# Patient Record
Sex: Female | Born: 1960
Health system: Southern US, Community
[De-identification: ages and names within clinical notes are randomized; demographics above are authoritative.]

## PROBLEM LIST (undated history)

## (undated) DIAGNOSIS — M109 Gout, unspecified: Secondary | ICD-10-CM

## (undated) DIAGNOSIS — M791 Myalgia, unspecified site: Secondary | ICD-10-CM

## (undated) DIAGNOSIS — F419 Anxiety disorder, unspecified: Secondary | ICD-10-CM

## (undated) DIAGNOSIS — M1990 Unspecified osteoarthritis, unspecified site: Secondary | ICD-10-CM

## (undated) DIAGNOSIS — G4730 Sleep apnea, unspecified: Secondary | ICD-10-CM

## (undated) DIAGNOSIS — K8020 Calculus of gallbladder without cholecystitis without obstruction: Secondary | ICD-10-CM

## (undated) DIAGNOSIS — R519 Headache, unspecified: Secondary | ICD-10-CM

## (undated) DIAGNOSIS — K56609 Unspecified intestinal obstruction, unspecified as to partial versus complete obstruction: Secondary | ICD-10-CM

## (undated) DIAGNOSIS — G5602 Carpal tunnel syndrome, left upper limb: Secondary | ICD-10-CM

## (undated) DIAGNOSIS — Z87442 Personal history of urinary calculi: Secondary | ICD-10-CM

## (undated) DIAGNOSIS — K219 Gastro-esophageal reflux disease without esophagitis: Secondary | ICD-10-CM

## (undated) DIAGNOSIS — F32A Depression, unspecified: Secondary | ICD-10-CM

## (undated) DIAGNOSIS — T884XXA Failed or difficult intubation, initial encounter: Secondary | ICD-10-CM

## (undated) DIAGNOSIS — F329 Major depressive disorder, single episode, unspecified: Secondary | ICD-10-CM

## (undated) DIAGNOSIS — N200 Calculus of kidney: Secondary | ICD-10-CM

## (undated) DIAGNOSIS — Z98811 Dental restoration status: Secondary | ICD-10-CM

## (undated) DIAGNOSIS — E785 Hyperlipidemia, unspecified: Secondary | ICD-10-CM

## (undated) DIAGNOSIS — R7303 Prediabetes: Secondary | ICD-10-CM

## (undated) DIAGNOSIS — R112 Nausea with vomiting, unspecified: Secondary | ICD-10-CM

## (undated) DIAGNOSIS — K222 Esophageal obstruction: Secondary | ICD-10-CM

## (undated) DIAGNOSIS — Z9889 Other specified postprocedural states: Secondary | ICD-10-CM

## (undated) DIAGNOSIS — I10 Essential (primary) hypertension: Secondary | ICD-10-CM

## (undated) DIAGNOSIS — J45909 Unspecified asthma, uncomplicated: Secondary | ICD-10-CM

## (undated) DIAGNOSIS — Z9189 Other specified personal risk factors, not elsewhere classified: Secondary | ICD-10-CM

## (undated) HISTORY — DX: Unspecified intestinal obstruction, unspecified as to partial versus complete obstruction: K56.609

## (undated) HISTORY — PX: TONSILLECTOMY AND ADENOIDECTOMY: SHX28

## (undated) HISTORY — DX: Anxiety disorder, unspecified: F41.9

## (undated) HISTORY — DX: Headache, unspecified: R51.9

## (undated) HISTORY — DX: Unspecified osteoarthritis, unspecified site: M19.90

## (undated) HISTORY — PX: ESOPHAGEAL DILATION: SHX303

## (undated) HISTORY — DX: Calculus of gallbladder without cholecystitis without obstruction: K80.20

## (undated) HISTORY — DX: Gastro-esophageal reflux disease without esophagitis: K21.9

## (undated) HISTORY — DX: Hyperlipidemia, unspecified: E78.5

## (undated) HISTORY — DX: Calculus of kidney: N20.0

## (undated) HISTORY — DX: Unspecified asthma, uncomplicated: J45.909

---

## 1983-12-01 HISTORY — PX: CHOLECYSTECTOMY: SHX55

## 2001-01-28 ENCOUNTER — Ambulatory Visit (HOSPITAL_COMMUNITY): Admission: RE | Admit: 2001-01-28 | Discharge: 2001-01-28 | Payer: Self-pay | Admitting: Obstetrics and Gynecology

## 2001-01-28 ENCOUNTER — Encounter (INDEPENDENT_AMBULATORY_CARE_PROVIDER_SITE_OTHER): Payer: Self-pay

## 2001-01-28 HISTORY — PX: HYSTEROSCOPY W/D&C: SHX1775

## 2001-05-26 ENCOUNTER — Encounter (INDEPENDENT_AMBULATORY_CARE_PROVIDER_SITE_OTHER): Payer: Self-pay

## 2001-05-26 ENCOUNTER — Other Ambulatory Visit: Admission: RE | Admit: 2001-05-26 | Discharge: 2001-05-26 | Payer: Self-pay | Admitting: Obstetrics and Gynecology

## 2003-06-14 ENCOUNTER — Encounter: Admission: RE | Admit: 2003-06-14 | Discharge: 2003-06-14 | Payer: Self-pay | Admitting: Family Medicine

## 2003-06-14 ENCOUNTER — Encounter: Payer: Self-pay | Admitting: Family Medicine

## 2003-10-11 ENCOUNTER — Other Ambulatory Visit: Admission: RE | Admit: 2003-10-11 | Discharge: 2003-10-11 | Payer: Self-pay | Admitting: Obstetrics and Gynecology

## 2004-12-25 ENCOUNTER — Other Ambulatory Visit: Admission: RE | Admit: 2004-12-25 | Discharge: 2004-12-25 | Payer: Self-pay | Admitting: Obstetrics and Gynecology

## 2005-07-19 ENCOUNTER — Encounter: Admission: RE | Admit: 2005-07-19 | Discharge: 2005-07-19 | Payer: Self-pay | Admitting: Orthopedic Surgery

## 2005-08-20 ENCOUNTER — Encounter: Admission: RE | Admit: 2005-08-20 | Discharge: 2005-08-20 | Payer: Self-pay | Admitting: Orthopedic Surgery

## 2008-09-24 ENCOUNTER — Observation Stay (HOSPITAL_COMMUNITY): Admission: EM | Admit: 2008-09-24 | Discharge: 2008-09-25 | Payer: Self-pay | Admitting: Emergency Medicine

## 2008-12-11 ENCOUNTER — Ambulatory Visit: Payer: Self-pay | Admitting: Pulmonary Disease

## 2008-12-11 ENCOUNTER — Inpatient Hospital Stay (HOSPITAL_COMMUNITY): Admission: RE | Admit: 2008-12-11 | Discharge: 2008-12-13 | Payer: Self-pay | Admitting: Obstetrics and Gynecology

## 2008-12-11 ENCOUNTER — Encounter (INDEPENDENT_AMBULATORY_CARE_PROVIDER_SITE_OTHER): Payer: Self-pay | Admitting: Obstetrics and Gynecology

## 2008-12-11 HISTORY — PX: BILATERAL SALPINGOOPHORECTOMY: SHX1223

## 2008-12-11 HISTORY — PX: LAPAROSCOPIC SUPRACERVICAL HYSTERECTOMY: SUR797

## 2009-03-05 ENCOUNTER — Encounter (INDEPENDENT_AMBULATORY_CARE_PROVIDER_SITE_OTHER): Payer: Self-pay | Admitting: *Deleted

## 2009-03-15 ENCOUNTER — Encounter (INDEPENDENT_AMBULATORY_CARE_PROVIDER_SITE_OTHER): Payer: Self-pay | Admitting: *Deleted

## 2009-08-24 ENCOUNTER — Emergency Department (HOSPITAL_COMMUNITY): Admission: EM | Admit: 2009-08-24 | Discharge: 2009-08-24 | Payer: Self-pay | Admitting: Emergency Medicine

## 2009-09-05 ENCOUNTER — Encounter: Admission: RE | Admit: 2009-09-05 | Discharge: 2009-09-05 | Payer: Self-pay | Admitting: Family Medicine

## 2009-10-12 ENCOUNTER — Encounter: Admission: RE | Admit: 2009-10-12 | Discharge: 2009-10-12 | Payer: Self-pay | Admitting: Gastroenterology

## 2009-12-12 ENCOUNTER — Telehealth: Payer: Self-pay | Admitting: Gastroenterology

## 2010-04-17 ENCOUNTER — Encounter (INDEPENDENT_AMBULATORY_CARE_PROVIDER_SITE_OTHER): Payer: Self-pay | Admitting: *Deleted

## 2010-04-21 ENCOUNTER — Ambulatory Visit: Payer: Self-pay | Admitting: Gastroenterology

## 2010-05-09 ENCOUNTER — Ambulatory Visit: Payer: Self-pay | Admitting: Gastroenterology

## 2010-05-18 ENCOUNTER — Encounter: Payer: Self-pay | Admitting: Gastroenterology

## 2010-12-21 ENCOUNTER — Encounter: Payer: Self-pay | Admitting: Family Medicine

## 2010-12-30 NOTE — Letter (Signed)
Summary: [facility] Instructions  [facility]  [address] [place], [place] [address]   Phone: [phone]  Fax: [phone]       [name]    1961-01-03    MRN: [id number]        Procedure Day /Date:  05/09/10   Friday     Arrival Time:  10:30am      Procedure Time:  11:30am     Location of Procedure:                    _ x_  [facility] (4th Floor)   PREPARATION FOR COLONOSCOPY WITH MOVIPREP   Starting 5 days prior to your procedure _6/5/11 _ do not eat nuts, seeds, popcorn, corn, beans, peas,  salads, or any raw vegetables.  Do not take any fiber supplements (e.g. Metamucil, Citrucel, and Benefiber).  THE DAY BEFORE YOUR PROCEDURE         DATE:  05/08/10  DAY:  Thursday  1.  Drink clear liquids the entire day-NO SOLID FOOD  2.  Do not drink anything colored red or purple.  Avoid juices with pulp.  No orange juice.  3.  Drink at least 64 oz. (8 glasses) of fluid/clear liquids during the day to prevent dehydration and help the prep work efficiently.  CLEAR LIQUIDS INCLUDE: Water Jello Ice Popsicles Tea (sugar ok, no milk/cream) Powdered fruit flavored drinks Coffee (sugar ok, no milk/cream) Gatorade Juice: apple, white grape, white cranberry  Lemonade Clear bullion, consomm, broth Carbonated beverages (any kind) Strained chicken noodle soup Hard Candy                             4.  In the morning, mix first dose of MoviPrep solution:    Empty 1 Pouch A and 1 Pouch B into the disposable container    Add lukewarm drinking water to the top line of the container. Mix to dissolve    Refrigerate (mixed solution should be used within 24 hrs)  5.  Begin drinking the prep at 5:00 p.m. The MoviPrep container is divided by 4 marks.   Every 15 minutes drink the solution down to the next mark (approximately 8 oz) until the full liter is complete.   6.  Follow completed prep with 16 oz of clear liquid of your choice (Nothing red or purple).   Continue to drink clear liquids until bedtime.  7.  Before going to bed, mix second dose of MoviPrep solution:    Empty 1 Pouch A and 1 Pouch B into the disposable container    Add lukewarm drinking water to the top line of the container. Mix to dissolve    Refrigerate  THE DAY OF YOUR PROCEDURE      DATE:   05/09/10  DAY:   Friday  Beginning at  6:30 a.m. (5 hours before procedure):         1. Every 15 minutes, drink the solution down to the next mark (approx 8 oz) until the full liter is complete.  2. Follow completed prep with 16 oz. of clear liquid of your choice.    3. You may drink clear liquids until  9:30am   (2 HOURS BEFORE PROCEDURE).   MEDICATION INSTRUCTIONS  Unless otherwise instructed, you should take regular prescription medications with a small sip of water   as early as possible the morning of your procedure.  OTHER INSTRUCTIONS  You will need a responsible adult at least 52 years of age to accompany you and drive you home.   This person must remain in the waiting room during your procedure.  Wear loose fitting clothing that is easily removed.  Leave jewelry and other valuables at home.  However, you may wish to bring a book to read or  an iPod/MP3 player to listen to music as you wait for your procedure to start.  Remove all body piercing jewelry and leave at home.  Total time from sign-in until discharge is approximately 2-3 hours.  You should go home directly after your procedure and rest.  You can resume normal activities the  day after your procedure.  The day of your procedure you should not:   Drive   Make legal decisions   Operate machinery   Drink alcohol   Return to work  You will receive specific instructions about eating, activities and medications before you leave.    The above instructions have been reviewed and explained to me by  Wyona Almas RN  Apr 21, 2010 4:32 PM     I fully understand and can verbalize  these instructions _____________________________ Date _________

## 2010-12-30 NOTE — Letter (Signed)
Summary: Patient Notice-Hyperplastic Polyps  [facility]  [address] [place], [place] [address]   Phone: [phone]  Fax: [phone]        May 18, 2010 MRN: [id number]    [name] [address] [place], [place]  [address]    Dear [name],  I am pleased to inform you that the colon polyp(s) removed during your recent colonoscopy was (were) found to be hyperplastic. These types of polyps are NOT pre-cancerous.  It is my recommendation that you have a repeat colonoscopy examination in 10 years for routine colorectal cancer screening.  Should you develop new or worsening symptoms of abdominal pain, bowel habit changes or bleeding from the rectum or bowels, please schedule an evaluation with either your primary care physician or with me.  Continue treatment plan as outlined the day of your exam.  Please call us if you are having persistent problems or have questions about your condition that have not been fully answered at this time.  Sincerely,  Meryl Dare MD [facility]  This letter has been electronically signed by your physician.  Appended Document: Patient Notice-Hyperplastic Polyps letter mailed.

## 2010-12-30 NOTE — Procedures (Signed)
Summary: Colonoscopy  Patient: [name] Note: All result statuses are Final unless otherwise noted.  Tests: (1) Colonoscopy (COL)   COL Colonoscopy           DONE     [facility]     [address]     [place], [place]  [address]           COLONOSCOPY PROCEDURE REPORT           PATIENT:  [name], [name]  MR#:  [id number]     BIRTHDATE:  Aug 29, 1961, 49 yrs. old  GENDER:  female     ENDOSCOPIST:  Judie Petit T. Russella Dar, MD, [facility]           PROCEDURE DATE:  05/09/2010     PROCEDURE:  Colonoscopy with biopsy     ASA CLASS:  Class II     INDICATIONS:  Routine Risk Screening     MEDICATIONS:   Fentanyl 75 mcg IV, Versed 7 mg IV     DESCRIPTION OF PROCEDURE:   After the risks benefits and     alternatives of the procedure were thoroughly explained, informed     consent was obtained.  Digital rectal exam was performed and     revealed no abnormalities.   The LB [id number]-[id number] endoscope     was introduced through the anus and advanced to the cecum, which     was identified by both the appendix and ileocecal valve, without     limitations.  The quality of the prep was excellent, using     MoviPrep.  The instrument was then slowly withdrawn as the colon     was fully examined.     <<PROCEDUREIMAGES>>     FINDINGS:  A sessile polyp was found in the sigmoid colon. It was     3 mm in size. The polyp was removed using cold biopsy forceps.  A     normal appearing cecum, ileocecal valve, and appendiceal orifice     were identified. The ascending, hepatic flexure, transverse,     splenic flexure, descending  colon, and rectum appeared     unremarkable.  Retroflexed views in the rectum revealed no     abnormalities. The time to cecum =  2.5  minutes. The scope was     then withdrawn (time =  9.75  min) from the patient and the     procedure completed.           COMPLICATIONS:  None           ENDOSCOPIC IMPRESSION:     1) 3 mm sessile polyp in the sigmoid colon            RECOMMENDATIONS:     1) Await pathology results     2) If the polyp removed today is adenomatous (pre-cancerous),     you will need a repeat colonoscopy in 5 years. Otherwise you     should continue to follow colorectal cancer screening guidelines     for "routine risk" patients with colonoscopy in 10 years.     Venita Lick. Russella Dar, MD, [facility]           CC: Catha Gosselin, MD           n.     Rosalie DoctorVenita Lick. Stark at 05/09/2010 12:07 PM           Stephaney, Steven, [phone]  Note: An exclamation mark (!) indicates a result that was  not dispersed into the flowsheet. Document Creation Date: 05/09/2010 12:08 PM _______________________________________________________________________  (1) Order result status: Final Collection or observation date-time: 05/09/2010 12:01 Requested date-time:  Receipt date-time:  Reported date-time:  Referring Physician:   Ordering Physician: Claudette Head [phone]) Specimen Source:  Source: Launa Grill Order Number: [id number] Lab site:   Appended Document: Colonoscopy     Procedures Next Due Date:    Colonoscopy: 04/2020

## 2010-12-30 NOTE — Miscellaneous (Signed)
Summary: [facility] Previsit/prep  Clinical Lists Changes  Medications: Added new medication of MOVIPREP 100 GM  SOLR (PEG-KCL-NACL-NASULF-NA ASC-C) As per prep instructions. - Signed Rx of MOVIPREP 100 GM  SOLR (PEG-KCL-NACL-NASULF-NA ASC-C) As per prep instructions.;  #1 x 0;  Signed;  Entered by: Wyona Almas RN;  Authorized by: Meryl Dare MD [facility];  Method used: Electronically to [id number] [address], [place], [place]  [address], Ph: [phone] or [phone], Fax: [phone] Allergies: Added new allergy or adverse reaction of ASPIRIN Observations: Added new observation of NKA: F (04/21/2010 16:02)    Prescriptions: MOVIPREP 100 GM  SOLR (PEG-KCL-NACL-NASULF-NA ASC-C) As per prep instructions.  #1 x 0   Entered by:   Wyona Almas RN   Authorized by:   Meryl Dare MD [facility]   Signed by:   Wyona Almas RN on 04/21/2010   Method used:   Electronically to        [id number] (retail)       [address]       [place], [place]  [address]       Ph: [phone] or [phone]       Fax: [phone]   RxID:   [id number]

## 2010-12-30 NOTE — Progress Notes (Signed)
Summary: Schedule Colonoscopy  Phone Note Outgoing Call   Call placed by: Hortense Ramal CMA Duncan Dull),  December 12, 2009 10:12 AM Call placed to: Patient Summary of Call: I have called to advise patient that it is time for her recall colonoscopy (due to her past history of diverticulosis and due to age). Patient states that she is unable to schedule at this moment but will call back shortly to do so. Initial call taken by: Hortense Ramal CMA Duncan Dull),  December 12, 2009 10:13 AM  Follow-up for Phone Call        Phone # busy x 2. Will call back at a later time. Hortense Ramal CMA Duncan Dull)  December 17, 2009 12:38 PM   Patient's phone is busy x 2. We will need to send her a letter. Follow-up by: Hortense Ramal CMA [facility] Dull),  December 20, 2009 10:11 AM

## 2011-03-06 LAB — DIFFERENTIAL
Basophils Absolute: 0.1 10*3/uL (ref 0.0–0.1)
Basophils Relative: 1 % (ref 0–1)
Monocytes Absolute: 0.8 10*3/uL (ref 0.1–1.0)
Neutrophils Relative %: 79 % — ABNORMAL HIGH (ref 43–77)

## 2011-03-06 LAB — URINALYSIS, ROUTINE W REFLEX MICROSCOPIC
Bilirubin Urine: NEGATIVE
Specific Gravity, Urine: 1.028 (ref 1.005–1.030)
Urobilinogen, UA: 0.2 mg/dL (ref 0.0–1.0)

## 2011-03-06 LAB — COMPREHENSIVE METABOLIC PANEL
BUN: 9 mg/dL (ref 6–23)
CO2: 28 mEq/L (ref 19–32)
Calcium: 9 mg/dL (ref 8.4–10.5)
GFR calc Af Amer: >60 mL/min (ref >60)
GFR calc non Af Amer: >60 mL/min (ref >60)
Glucose, Bld: 98 mg/dL (ref 70–99)
Sodium: 140 mEq/L (ref 135–145)

## 2011-03-06 LAB — CBC
HCT: 37.2 % (ref 36.0–46.0)
MCHC: 32.7 g/dL (ref 30.0–36.0)

## 2011-03-06 LAB — TRIGLYCERIDES: Triglycerides: 114 mg/dL (ref <150)

## 2011-03-06 LAB — CK: Total CK: 42 U/L (ref 7–177)

## 2011-03-06 LAB — URINE MICROSCOPIC-ADD ON

## 2011-03-16 LAB — COMPREHENSIVE METABOLIC PANEL
ALT: 10 U/L (ref 0–35)
AST: 17 U/L (ref 0–37)
Albumin: 3 g/dL — ABNORMAL LOW (ref 3.5–5.2)
Albumin: 3.4 g/dL — ABNORMAL LOW (ref 3.5–5.2)
Alkaline Phosphatase: 58 U/L (ref 39–117)
BUN: 7 mg/dL (ref 6–23)
CO2: 27 mEq/L (ref 19–32)
CO2: 30 mEq/L (ref 19–32)
Calcium: 8.1 mg/dL — ABNORMAL LOW (ref 8.4–10.5)
Calcium: 8.3 mg/dL — ABNORMAL LOW (ref 8.4–10.5)
Calcium: 8.5 mg/dL (ref 8.4–10.5)
Chloride: 103 mEq/L (ref 96–112)
Creatinine, Ser: 0.58 mg/dL (ref 0.4–1.2)
GFR calc Af Amer: >60 mL/min (ref >60)
GFR calc Af Amer: >60 mL/min (ref >60)
GFR calc non Af Amer: >60 mL/min (ref >60)
GFR calc non Af Amer: >60 mL/min (ref >60)
Glucose, Bld: 150 mg/dL — ABNORMAL HIGH (ref 70–99)
Potassium: 3.7 mEq/L (ref 3.5–5.1)
Sodium: 137 mEq/L (ref 135–145)
Sodium: 138 mEq/L (ref 135–145)
Sodium: 139 mEq/L (ref 135–145)
Total Bilirubin: 0.4 mg/dL (ref 0.3–1.2)
Total Protein: 5.7 g/dL — ABNORMAL LOW (ref 6.0–8.3)
Total Protein: 6.3 g/dL (ref 6.0–8.3)

## 2011-03-16 LAB — CBC
HCT: 25.4 % — ABNORMAL LOW (ref 36.0–46.0)
Hemoglobin: 8.2 g/dL — ABNORMAL LOW (ref 12.0–15.0)
MCHC: 31.8 g/dL (ref 30.0–36.0)
MCHC: 32.3 g/dL (ref 30.0–36.0)
MCHC: 32.5 g/dL (ref 30.0–36.0)
MCHC: 32.6 g/dL (ref 30.0–36.0)
MCV: 75.4 fL — ABNORMAL LOW (ref 78.0–100.0)
MCV: 75.8 fL — ABNORMAL LOW (ref 78.0–100.0)
Platelets: 289 10*3/uL (ref 150–400)
Platelets: 315 10*3/uL (ref 150–400)
RBC: 3.8 MIL/uL — ABNORMAL LOW (ref 3.87–5.11)
RDW: 19.8 % — ABNORMAL HIGH (ref 11.5–15.5)
RDW: 19.9 % — ABNORMAL HIGH (ref 11.5–15.5)
WBC: 14.1 10*3/uL — ABNORMAL HIGH (ref 4.0–10.5)
WBC: 14.5 10*3/uL — ABNORMAL HIGH (ref 4.0–10.5)
WBC: 6.3 10*3/uL (ref 4.0–10.5)

## 2011-03-16 LAB — TYPE AND SCREEN: Antibody Screen: NEGATIVE

## 2011-04-14 NOTE — H&P (Signed)
NAMESHARISA, [name]                   ACCOUNT NO.:  [id number]   MEDICAL RECORD NO.:  [id number]          PATIENT TYPE:  INP   LOCATION:  3705                         FACILITY:  [facility]   PHYSICIAN:  Ramiro Harvest, MD    DATE OF BIRTH:  13-Feb-1961   DATE OF ADMISSION:  09/24/2008  DATE OF DISCHARGE:                              HISTORY & PHYSICAL   PRIMARY CARE PHYSICIAN:  [name], MD   NEUROLOGIST:  Melvyn Novas, MD   HISTORY OF PRESENT ILLNESS:  [name] is a 50 year old obese white  female with history of cholecystectomy, hypertriglyceridemia, GERD,  hiatal hernia, family history of AAA who presents from PCP's office to  the ED with a 2-week history of squeezing midsternal chest pain lasting  seconds, occurring on exertion with associated shortness of breath.  The  patient states that sometimes the chest pain radiates to head causing a  headache on the top and the back of the head.  No paroxysmal nocturnal  dyspnea.  No orthopnea.  No chills.  No fever.  No nausea.  No vomiting.  No diaphoresis.  No melena.  No hematochezia.  No hematemesis.  No  burning sensation.  The patient is also complaining of abdominal pain  that has been ongoing for several months and worsening over the past 2  weeks, describes as an aching feeling, nonradiating.  No fever.  No  chills.  No nausea.  No vomiting.  No cough.  No diarrhea.  No  constipation.  No alleviating factors.  The patient states that  abdominal pain is also associated with meals as denies any NSAID use.  The patient states that abdominal pain similar to the pain she had prior  to her cholecystectomy.  The patient does endorse somewhat generalized  fatigue and headache.  The patient being consulted on per Cardiology.  The patient is being admitted for further evaluation and  recommendations.   ALLERGIES:  AVELOX, NAPROSYN, ASPIRIN, DAYPRO, and DEMEROL.   PAST MEDICAL HISTORY:  1. GERD.  2. Hiatal hernia.  3. History  of nephrolithiasis.  4. Obstructive sleep apnea.  5. Mild depression.  6. Fibroids.  7. Status post cholecystectomy in 1985.  8. Hypertriglyceridemia.  9. History of Lyme disease in 1990.  10.History of chronic diarrhea in the past.  11.History of occasional dysphagia in the past.  12.Status post D and C and hysteroscopy in January 28, 2001.  13.Prior history of asthma.   MEDICATIONS:  1. Lexapro 10 mg daily.  2. Prilosec 20 mg daily as needed.   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  She is married, lives in [place].  No tobacco use.  No alcohol use.  No IV drug use.  The patient has two children, all of whom are healthy.   FAMILY HISTORY:  Father deceased at age 62 from emphysema, also did have  a history of 3 aneurysms, history of hypertension, coronary artery  disease, and history of peripheral vascular disease.  Mother alive age  75 with a history of hypertension, diabetes, depression, and  dementia.  One cousin who is now diseased at a history of spina bifida and the  grandmother with a history of diabetes.   REVIEW OF SYSTEMS:  As per HPI, otherwise negative.   PHYSICAL EXAM:  VITAL SIGNS:  Temperature 98.5, blood pressure 179/91,  pulse of 64, respiratory rate 20, and sating 99% on room air.  GENERAL:  The patient in no apparent distress.  HEENT:  Normocephalic and atraumatic.  Pupils are equal, round, and  reactive to light and accommodation.  Extraocular movements are intact.  Oropharynx is clear.  No lesions.  No exudates.  NECK:  Supple.  No lymphadenopathy.  RESPIRATORY:  Lungs are clear to auscultation bilaterally.  No wheezes.  No crackles.  No rhonchi.  CARDIOVASCULAR:  Regular rate and rhythm.  No murmurs, rubs, or gallops.  ABDOMEN:  Soft, positive bowel sounds.  Tenderness to palpation in the  epigastrium in the right upper quadrant.  EXTREMITIES:  No clubbing, cyanosis, or edema.  NEUROLOGIC:  The patient is alert and  oriented x3.  Cranial nerves II  through XII are grossly intact.  No focal deficits.   ADMISSION LABS:  Beta-hCG was less than 2, magnesium level of 2.2.  CBC;  white count 5.9, hemoglobin 9.9, hematocrit 31.5, platelets of 288, and  ANC of 4.3.  Point of care cardiac markers; CK-MB less than 1, troponin  I less than 0.05, and myoglobin of 34.7.  Comprehensive metabolic  profile; sodium 139, potassium 3.7, chloride 105, bicarb 28, glucose 88,  BUN 7, creatinine 0.50, bilirubin of 0.5, alk phosphatase 58, AST 16,  ALT 9, total protein 6.4, albumin 3.3, calcium of 8.7, amylase 34,  lipase of 17, CK of 42, CK-MB 1.0, and troponin I of less than 0.01.  Chest x-ray shows cardiomegaly and pulmonary vascular congestion.  No  focal airspace disease or effusion.  EKG with a normal sinus rhythm.   ASSESSMENT AND PLAN:  [name] is a 50 year old obese female with  history of status post cholecystectomy, history of hiatal hernia and  gastroesophageal reflux disease, positive family history of abdominal  aortic aneurysm presented to the ED with chest pain and abdominal pain.  1. Chest pain, questionable etiology, acute coronary artery syndrome      versus gastrointestinal related.  We will admit the patient to      telemetry, cycle cardiac enzymes q.8 h. x3, check a TSH, check a      BNP.  We will place on oxygen also.  Also, we will place on      Protonix.  Cardiology is consulting and following, and I appreciate      their input on recommendation.  2. Abdominal pain.  The patient does have a family history of      abdominal aortic aneurysm.  The patient is status post      cholecystectomy, questionable etiology could be gastrointestinal      disease related versus #1, which lipase levels have been normal.      Amylase levels have been normal.  LFTs are within normal limits.      We will check a CT of the abdomen and pelvis to rule out abdominal      pathology supportive care.  We will place on a  Protonix and will      follow.  3. Hypertension.  We will start the patient on a Norvasc and      olmesartan and titrate as needed.  4. Depression.  Continue home-dose Lexapro.  5. Gastroesophageal reflux disease.  Continue Protonix.  6. Obstructive sleep apnea.  Continuous positive airway pressure at      bedtime.  7. Fibroids.  8. Prophylaxis.  Protonix for gastrointestinal prophylaxis, Lovenox      for deep venous thrombosis prophylaxis.   It has been a pleasure taking care of Mrs. Eduard Clos.      Ramiro Harvest, MD  Electronically Signed     DT/MEDQ  D:  09/24/2008  T:  09/25/2008  Job:  [id number]   cc:   [name], M.D.  Melvyn Novas, M.D.

## 2011-04-14 NOTE — Op Note (Signed)
NAME:  [name], [name]                   ACCOUNT NO.:  [id number]   MEDICAL RECORD NO.:  [id number]          PATIENT TYPE:  INP   LOCATION:  9371                          FACILITY:  [facility]   PHYSICIAN:  Guy Sandifer. Henderson Cloud, M.D. DATE OF BIRTH:  Nov 09, 1961   DATE OF PROCEDURE:  12/11/2008  DATE OF DISCHARGE:                               OPERATIVE REPORT   PREOPERATIVE DIAGNOSIS:  Uterine leiomyomata.   POSTOPERATIVE DIAGNOSIS:  Uterine leiomyomata.   PROCEDURE:  Abdominal supracervical hysterectomy, bilateral salpingo-  oophorectomy, cystoscopy, and laparoscopy.   SURGEON:  Guy Sandifer. Henderson Cloud, MD   ASSISTANT:  Duke Salvia. Marcelle Overlie, MD   ANESTHESIA:  General with endotracheal intubation, Quillian Quince, MD   SPECIMENS:  Uterine fundus, bilateral tubes and ovaries to Pathology.   ESTIMATED BLOOD LOSS:  2500 mL.   INDICATIONS AND CONSENT:  This patient is a 50 year old married white  female, G2, P2, husband status post vasectomy, who is morbidly obese  with growing uterine leiomyomata.  Details are dictated in the history  and physical.  Laparoscopically-assisted vaginal hysterectomy, bilateral  salpingo-oophorectomy, and possible abdominal hysterectomy is discussed  preoperatively.  Potential risks and complications have been discussed  with the patient preoperatively including, but not limited to infection,  organ damage, bleeding requiring transfusion of blood products with HIV  and hepatitis acquisition, DVT, PE, pneumonia, fistula formation,  postoperative pain.  Issues of the menopause have been reviewed as well.  All questions have been answered and consent is signed on the chart.   FINDINGS:  There are some omental adhesions immediately below the  umbilicus.  The uterus is about 12-14 weeks in size with multiple  leiomyomata ranging from 4-6 cm with one about 6 cm on the anterior  lower uterine segment.  The ovaries appeared normal bilaterally.  However, the infundibulopelvic  ligament on the right adnexa is unusually  short, but without adhesions.  The left tube and ovary appears normal.  Anterior and posterior cul-de-sacs appear normal.   PROCEDURE:  The patient was taken to the operating room where she was  identified, placed in the dorsal supine position, and she was intubated  via endotracheal intubation.  She was then placed in the dorsal  lithotomy position where she was prepped abdominally and vaginally.  Bladder straight catheterized, and Hulka tenaculum was placed in the  uterus as a manipulator.  The infraumbilical and suprapubic areas were  injected in the midline with 1% plain Xylocaine.  A small infraumbilical  incision was made.  A long disposable Veress needle was placed with a  normal syringe and drop test.  Gas 2 L were then insufflated under low  pressure with good tympany.  Veress needle was removed.  Then, the 12-mm  bladeless trocar sleeve was placed using direct visualization with the  diagnostic laparoscope.  After placement, the operative laparoscope was  placed.  The omental adhesions could be avoided by going to the left  side of the adhesions which allowed complete visualization of the  pelvis.  A small suprapubic incision was made, and a 5-mm disposable  trocar sleeve was placed under direct visualization, and later a left  lower quadrant 5-mm disposable trocar sleeve was placed under direct  visualization as well.  Despite vigorous traction, the right adnexa  could not be adequately visualized.  It is felt that this would  compromise the ability for safe right salpingo-oophorectomy either with  or without the uterus in place.  Therefore, the decision to proceed to  the laparotomy was made.  Laparoscopic instruments were removed.  The  umbilical incision was closed with a 0 Vicryl suture in the deep  underlying layers with good visualization and 3-0 Vicryl suture in a  subcuticular fashion.  A Pfannenstiel incision was then made  which  incorporated the lower 5-mm trocar sites.  This was made on the superior  part of the panniculus, but was in fact above the symphysis pubis.  Dissection was carried out in layers to the fascia which was extended  bilaterally.  Peritoneum was then bluntly perforated and extended  superiorly and inferiorly.  O'Sullivan-O'Connor retractor was placed.  The bladder blade was placed.  Superior bowel was packed away with  multiple packs, and the superior blade was placed.  Exposure was  difficult, and each pedicle required manipulation of the retractors to  provide adequate exposure.  Because of difficulty again visualizing the  right adnexa, the right proximal ligaments were taken down in a stepwise  fashion using the LigaSure bipolar cautery.  This was carried down to  the lower part of the uterine fundus which was in fact above the level  of the 6-cm subserosal fibroid in the lower anterior uterine segment.  The left infundibulopelvic pelvic ligament was taken down with the  LigaSure bipolar instrument, which again was carried down to the similar  location on the uterine fundus.  Then, using a wide malleable as a  backstop to protect the bowel, the superior uterine fundus was sharply  resected out.  To further gain exposure, the fibroid in the lower  anterior uterine segment was removed in a myomectomy-type fashion.  Bleeders were controlled on the way down with clamps and cautery.  After  removal of the final fibroid, the uterine fundus was transected at the  level of the superior internal cervical os with scissors.  The cervical  stump was closed with interrupted 0 Monocryl suture.  There was a  bleeder on the right-hand side of the cervical stump.  Attempts at  controlling this with cautery were initially unsuccessful.  It was  difficult to get to 4 sutures and #4 large vascular clips were applied.  This controlled portion of the bleeding.  Two additional 0 Monocryl  sutures were  placed.  Finally, small amount of bleeding on the right  side of the cervical stump was controlled again with the LigaSure  device.  This achieved good hemostasis.  The right adnexa was then  exposed, and the right infundibulopelvic ligament was taken down in a  stepwise fashion with the LigaSure instrument which removed the right  adnexa.  Some bleeders at the base of this were controlled with 2-0  Monocryl sutures which obtained complete hemostasis.  Copious irrigation  was carried out.  All returns was clear.  Gelfoam was placed at the  right hand base of the cervical stump.  Reinspection reveals good  hemostasis again at all sites.  Packs were removed, retractors were  removed, and initial count was correct.  The anterior peritoneum was  closed in running fashion with 0 Monocryl suture.  The first catheter  for the On-Q system was placed subfascially and exited to the left  superior side of the incision.  The fascia was then closed starting at  each angle and meeting in the middle with 0 PDS suture using the double  loop suture.  The second catheter for the On-Q was placed superior to  the fascia and exited to the right superior side of the incision.  Good  hemostasis was obtained.  Sutures of 0 plain were used on the  subcutaneous tissue to close this space and the skin was closed with  clips.  Both On-Q catheters were flushed with 5 mL of 1% plain  Xylocaine.  The On-Q bulb with 0.5% Marcaine was then attached per  protocol.  Both of these catheters were taped in place with Steri-Strips  and were then covered with Covaderm at both sites.  The patient was  given indigo carmine while the fascia was being closed.  Cystoscopy was  then carried out with 73 cystoscope.  A 360-degree inspection revealed  no evidence of trauma to the bladder and no foreign bodies.  There was a  good puff of indigo carmine noted from the ureters bilaterally.  The  cystoscope was removed.  Foley catheter was  replaced.  Pressure dressing  was applied.  The  patient remained stable throughout the procedure.  She was crossed for 2  units of blood during the procedure, although she remained stable and  will be further evaluated in recovery.  She was awakened and transported  to recovery in stable condition.  All counts were correct.      Guy Sandifer Henderson Cloud, M.D.  Electronically Signed     JET/MEDQ  D:  12/11/2008  T:  12/12/2008  Job:  [id number]

## 2011-04-14 NOTE — Discharge Summary (Signed)
NAME:  [name], Amanda                   ACCOUNT NO.:  [id number]   MEDICAL RECORD NO.:  [id number]          PATIENT TYPE:  INP   LOCATION:  9371                          FACILITY:  [facility]   PHYSICIAN:  Guy Sandifer. Henderson Cloud, M.D. DATE OF BIRTH:  1961-10-13   DATE OF ADMISSION:  12/11/2008  DATE OF DISCHARGE:  12/13/2008                               DISCHARGE SUMMARY   ADMITTING DIAGNOSIS:  Uterine leiomyomata.   DISCHARGE DIAGNOSIS:  Uterine leiomyomata.   PROCEDURES:  On December 11, 2008, abdominal supracervical hysterectomy,  bilateral salpingo-oophorectomy, cystoscopy, and laparoscopy.   REASON FOR ADMISSION:  This patient is a 50 year old married white  female with known uterine leiomyomata.  She is admitted for a surgical  management.   HOSPITAL COURSE:  The patient was taken to the operating room where she  undergoes laparoscopy.  This was then converted to laparotomy where she  undergoes abdominal supracervical hysterectomy with bilateral salpingo-  oophorectomy and cystoscopy.  Estimated blood loss by anesthesia was  2400 mL.  She had marked amount of periorbital edema and extubation.  Postoperatively, it was felt to be unsafe by Anesthesiology.  She also  has known sleep apnea and admission to the ICU, postoperatively was  planned.  She was transferred to the MICU, intubated.  Her pulmonary  status was managed by Dr. Sung Amabile.  She was subsequently extubated later  that afternoon without difficulty.  On the evening of December 11, 2008,  she was feeling good and had good pain relief.  No shortness of breath.  Vital signs were stable.  She was afebrile with clear urine output.  O2  saturations were over 95% with her oxygen mask.  Hemoglobin of 9.3 was  noted.  Following day, the patient was again evaluated by Critical Care  Medicine.  She was noted to be feeling okay with good pain relief.  She  had not yet passed flatus.  She was tolerating sips of liquids with  stable vital signs and  she was afebrile.  Oxygen saturation was 98%.  Hemoglobin was 8.2 and pathology is pending.  CPAP  management was  continued per Critical Care Medicine.  By the next morning, she was  noted to have maintained her saturations well the night before with her  CPAP and room air.  On the morning of the 14th, she is ambulating,  tolerating regular diet, and not yet passed flatus.  Vital signs were  stable and she is afebrile.  She was given a Dulcolax suppository.  By  7:30 p.m., she has passed flatus and had a bowel movement.  She is  ambulating very well.  She has not taken pain medicine for the last 7-8  hours.  Would like to go home.  Vital signs were stable and she is  afebrile.  Abdomen is soft with good bowel sounds.  Her incision is  healing well with no induration or erythema.  The On-Q catheters were  removed intact and dressings were applied.   CONDITION ON DISCHARGE:  Good.   DIET:  Regular as tolerated.   ACTIVITY:  No lifting, no operation of automobiles, and no vaginal  entry.  She is to call the office for problems including, but not  limited to temperature of 101 degrees, persistent nausea and vomiting,  or increasing pain.   MEDICATIONS:  1. Percocet 5/225 mg #40 one to two p.o. q.6 h. p.r.n.  2. Ferrous sulfate 1 p.o. daily.  3. Tylenol dosing is reviewed.   FOLLOWUP:  In the office in 4 days for reevaluation and probable staple  removal.      Guy Sandifer. Henderson Cloud, M.D.  Electronically Signed     JET/MEDQ  D:  12/13/2008  T:  12/14/2008  Job:  [id number]   cc:   Oley Balm. Sung Amabile, MD  [address]  [place]  [place] [address]

## 2011-04-14 NOTE — Discharge Summary (Signed)
[name], [name]                   ACCOUNT NO.:  [id number]   MEDICAL RECORD NO.:  [id number]          PATIENT TYPE:  INP   LOCATION:  3705                         FACILITY:  [facility]   PHYSICIAN:  Ramiro Harvest, MD    DATE OF BIRTH:  01-Sep-1961   DATE OF ADMISSION:  09/24/2008  DATE OF DISCHARGE:  09/25/2008                               DISCHARGE SUMMARY   PRIMARY CARE PHYSICIAN:  [name], M.D. of [facility].   CARDIOLOGIST:  [name], MD. of [facility].   DISCHARGE DIAGNOSES:  1. Chest pain.  2. Abdominal pain likely secondary to gastrointestinal.  3. Low HDL/elevated triglycerides.  4. Anemia.  5. Gastroesophageal reflux disease.  6. Hiatal hernia.  7. History of nephrolithiasis.  8. Obstructive sleep apnea.  9. Mild depression.  10.Fibroids.  11.Status post cholecystectomy in 1985.  12.History of Lyme disease in 1990.  13.History of chronic diarrhea in the past.  14.History of occasional dysphagia in the past.  15.Status post D&C and hysteroscopy on January 28, 2001 secondary to      endometrial polyps.  16.Prior history of asthma.  17.Hypertension.   DISCHARGE MEDICATIONS:  1. Fish oil 1000 mg 2 tablets daily.  2. Benicar 20 mg p.o. daily.  3. Lexapro 10 mg p.o. daily.  4. Prilosec 20 mg p.o. daily.   DISPOSITION/FOLLOW UP:  The patient will be discharged home.  The  patient is to follow up with Dr. Anne Fu of [facility] on Thursday,  September 27, 2008 at 1:15 p.m. for outpatient stress test and outpatient  echocardiogram.  The patient is also to follow up with Dr. Theresia Lo in 2  weeks for follow up on abdominal pain.  The patient may need an  outpatient GI referral if still having abdominal symptoms.  The patient  will need a CBC checked to follow up on  hemoglobin.  The patient is a  menstruating female and as such will have slight iron-deficiency anemia.   CONSULTATIONS:  A cardiology consult was done.  The patient was seen in  consultation by Dr. Anne Fu of [facility] on September 24, 2008.   PROCEDURES PERFORMED:  1. A chest x-ray was done September 24, 2008 that showed cardiomegaly      and pulmonary vascular congestion.  2. CT of the chest was done on September 24, 2008 which showed mild left      main coronary artery calcification, mild cardiomegaly, no acute      cardiopulmonary disease, scattered areas of focal air trapping in      both lungs consistent with asthma, scattered subpleural nodules in      both lungs all less than 4 mm in size, specifically benign.  The      patient is a smoker and has a history of malignancy.  Please see      below for follow up recommendations which will be a follow up CT in      1 year.  3. CT of the abdomen showed splenomegaly without focal splenic      parenchymal abnormality.  No  acute abnormalities in the abdomen.      Right ovarian cyst approximately 5 cm, small left ovarian cyst      approximately 2.7 cm, numerous uterine fibroids, umbilical hernia      containing fat.   ADMISSION HISTORY/PHYSICAL:  [name] is a 50 year old obese female  with a history of cholecystectomy, hypertriglyceridemia,  gastroesophageal reflux disease, hiatal hernia, family history of AAA  who presented from her PCP's office to the ED with a 2 week history of  squeezing midsternal chest pain lasting seconds or occurring on exertion  with associated shortness of breath.  The patient states that sometimes  the chest pain radiates to her head causing a headache on the top and  back of her head.  No nocturnal dyspnea, no orthopnea, no fever, no  chills, no nausea, no vomiting, no diaphoresis, no melena, no  hematochezia, no hematemesis, no burning sensation.  The patient also  complained of abdominal pain that has been ongoing for several months  and worsening over the past 2 weeks.  Describes as an achy feeling,  nonradiating.  Denies any fever, no chills, no nausea, no vomiting, no   cough, no diarrhea, no constipation, no alleviating factors.  The  patient states her abdominal pain is associated with meals and denies  any NSAID use.  The patient states that abdominal pain is similar to the  pain she had prior to her cholecystectomy.  The patient does endorse  somewhat generalized fatigue and a headache.  The patient has been  consulted on per cardiology.  The patient is being admitted for further  evaluation and recommendations.   PHYSICAL EXAMINATION:  VITAL SIGNS:  Temperature 98.5, blood pressure  179/91, pulse of 64, respiratory rate 20, satting 99% on room air.  GENERAL:  The patient in no apparent distress.  HEENT:  Normocephalic, atraumatic.  Pupils are equal, round and reactive  to light and accommodation.  Extraocular movements are intact.  Oropharynx is clear.  No lesions.  No exudates.  NECK:  Supple.  No lymphadenopathy.  RESPIRATORY:  Lungs are clear to auscultation bilaterally.  No wheezing.  No crackles.  No rhonchi.  CARDIOVASCULAR:  Regular rate and rhythm.  No murmurs, rubs or gallops.  ABDOMEN:  Soft, positive bowel sounds.  Tenderness to palpation in  epigastrium and in the right upper quadrant.  EXTREMITIES:  No clubbing, cyanosis or edema.  NEUROLOGIC:  The patient is alert and oriented x3.  Cranial nerves II-  XII are grossly intact.  No focal deficits.   LABORATORY DATA:  Beta HCG was less than 2, magnesium level of 2.2.  CBC:  White count 5.9, hemoglobin 9.91, hematocrit 31.5, platelets of  288, ANC of 4.3.  Point of care cardiac markers:  CK-MB less than 1,  troponin-I less than 0.05, myoglobin of 34.7.  Comprehensive metabolic  profile:  Sodium 139, potassium 3.7, chloride 105, bicarb 28, glucose  88, BUN 7, creatinine 0.5, bilirubin of 0.5, alk phosphatase 58, AST 16,  ALT 9, total protein 6.4, albumin 3.3, calcium of 8.7, amylase of 34,  lipase was 17, CK of 42, CK-MB 1.0, troponin-I less than 0.01.  Discharge labs:  Sodium 141,  potassium 4.0, chloride 105, bicarb 29, BUN  7, creatinine 0.81, glucose of 97, calcium of 8.5.  CBC:  White count  5.8, hemoglobin 9.5, platelets of 251, hematocrit of 29.9, ANC of 4.3,  magnesium of 2.2, lipase 17, amylase 34, TSH of 0.977, cholesterol 153,  triglycerides 197,  HDL of 28, LDL of 86, BNP of 249.   DIAGNOSTICS:  1. Chest x-ray with cardiomegaly and pulmonary vascular congestion.      No focal airspace disease or effusion.  2. EKG with normal sinus rhythm.   HOSPITAL COURSE:  1. Chest pain.  The patient was brought in for chest pain rule out MI.      The patient was admitted to a telemetry bed.  Cardiac enzymes were      cycled q.8 h. x3 which came back within normal limits.  TSH was      obtained which was within normal limits.  BNP was less than 30.      The patient was placed on oxygen, was placed on also Protonix as      well and morphine as needed for chest pain.  Cardiology was      consulted.  The patient was seen in consultation by Dr. Donato Schultz      on July 25, 2008 and it was felt that the patient was low risk      and will benefit from outpatient stress test.  The patient's      symptoms improved during the hospitalization and by day of      discharge the patient was in stable and improved condition.  The      patient was placed on Benicar and Norvasc during the      hospitalization to help with hypertension which improves the      patient's blood pressure.  The patient will be discharged home in      stable and improved condition for follow up with Dr. Anne Fu on      September 27, 2008 at 1:15 p.m. for outpatient stress test and      echocardiogram.  2. Abdominal pain.  The patient had some concerns as well as the PCP      had some concerns for aneurysm as the patient did have a family      history of abdominal aortic aneurysm.  The patient had a CT of the      abdomen and pelvis as stated above which came back essentially      unremarkable.  Lipase  levels were ordered which were within normal      limits.  Amylase levels were ordered which were within normal      limits.  The patient's LFTs were unremarkable as well.  The patient      did have a bowel movement during the hospitalization and had some      improvement/relief in her abdominal pain.  The patient will be      maintained on Protonix on discharge and will follow up with PCP as      outpatient for further evaluation of her abdominal pain.  3. Hypertension.  The patient was noted to be hypertensive on      admission.  The patient was initially placed on Norvasc and      Benicar.  The patient was maintained on these.  The patient will be      discharged home on Benicar with a follow up with PCP to reassess      blood pressure and follow up on the patient's blood pressure      medications.  The rest of the patient's chronic medical issues were      stable throughout the hospitalization.  The patient will be      discharged in stable and improved condition.  On day of discharge vital signs:  Temperature of 98.6, pulse of 63,  blood pressure 120/75, respiratory rate 18, satting 94% on room air.   It was a pleasure taking care of Ms. Eduard Clos.      Ramiro Harvest, MD  Electronically Signed     DT/MEDQ  D:  09/25/2008  T:  09/25/2008  Job:  [id number]   cc:   [name], M.D.  [name], MD

## 2011-04-14 NOTE — Consult Note (Signed)
NAMEJAQUAY, MORNEAULT                   ACCOUNT NO.:  [id number]   MEDICAL RECORD NO.:  [id number]          PATIENT TYPE:  INP   LOCATION:  3705                         FACILITY:  [facility]   PHYSICIAN:  [name], MD      DATE OF BIRTH:  05-30-1961   DATE OF CONSULTATION:  DATE OF DISCHARGE:                                 CONSULTATION   REFERRING PHYSICIAN:  Nelva Nay, MD, [facility].   PRIMARY CARE PHYSICIAN:  [name], MD   REASON FOR CONSULTATION:  Ms. Janicki is being seen for the evaluation of  chest pain at the request of Dr. Radford Pax.   HISTORY OF PRESENT ILLNESS:  A 50 year old female status post  cholecystectomy in the 1980s with GERD, asthma, depression, newly  discovered hypertension with no previous history of coronary artery  disease who has been complaining of over the past several weeks two  separate issues.  One, squeezing chest pain that is very intense 8/10,  lasting only a few seconds in duration, substernal with radiation into  the top of the head.  This can be exertional at times but does not have  to be exertional.  These usually fleeting.  She also complains of  constant headache for the past 2 days unrelieved with Tylenol.  In  addition, she complains about epigastric discomfort, which feels like  gallbladder disease but denies any nausea, vomiting, diarrhea, or  fevers.  She says that the epigastric discomfort and right upper  quadrant discomfort is worse with fatty food intake.  She is worried  that she may have pancreatitis or AAA like her grandfather and father  had.  Currently, she is lying comfortably in bed in no distress.   PAST MEDICAL HISTORY:  1. GERD.  2. Asthma.  3. Depression.  4. Hypertension.  5. Nephrolithiasis.  6. Morbid obesity.  7. Fibroids.  She has planned to have a hysterectomy in January.  8. Obstructive sleep apnea, wear CPAP.   PAST SURGICAL HISTORY:  Cholecystectomy in 1984.   ALLERGIES:   AVELOX, NAPROSYN, and ASPIRIN, ASPIRIN causes anaphylaxis,  DEMEROL, and DAYPRO.   CURRENT MEDICATIONS:  Lexapro 10 mg once a day.   SOCIAL HISTORY:  She lives here in [place] with her husband and  denies any tobacco, alcohol, or illicit drug use.  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   FAMILY HISTORY:  Her father had issues with abdominal aortic aneurysm as  well as her grandfather.   On review of systems, she has had increased fatigue, constant headache,  chest pain, abdominal discomfort, as described above.  She denies any  significant shortness of breath.  No syncope.  No bleeding.  Unless  specified above, all other 12 review of systems negative.   PHYSICAL EXAMINATION:  VITAL SIGNS:  Temperature 98.5, pulse 64,  respirations 20, blood pressure originally 179/91, and satting 99% on  room air.  GENERAL:  Alert and oriented x3, resting comfortably in bed, mildly  anxious, and sitting next to her husband.  EYES:  Possible exophthalmos  prominent, no scleral icterus.  EOMI.  NECK:  Thick.  No lymphadenopathy.  No thyromegaly.  No carotid bruits  appreciated.  No JVD appreciated.  CARDIOVASCULAR:  Regular rate and rhythm without any appreciable  murmurs, rubs, or gallops.  Difficult to palpate PMI.  LUNGS:  Clear to auscultation bilaterally.  No wheezes.  No rales.  Normal respiratory effort.  ABDOMEN:  Mild tenderness in the right upper quadrant noted, otherwise  no pulsatile masses at midline.  No bruits.  Positive bowel sounds.  EXTREMITIES:  No clubbing, cyanosis, or edema noted.  She does have  thickened or increased subcutaneous tissue in her lower extremities.  Palpable distal pulses present of 2+.  NEUROLOGIC:  Nonfocal.  No tremors noted.  SKIN:  Warm, dry, and intact.  No rashes.  PSYCH:  Mildly anxious but overall normal affect.   DATA:  EKG personally reviewed shows normal sinus rhythm, borderline  prolonged QT interval  with QTc of 435, sinus rhythm without any other  abnormalities.  When compared to ECG performed today in Dr. Aurelio Brash  office, there is no significant change except at the QT interval was  shortened.  Chest x-ray personally reviewed shows borderline  cardiomegaly, otherwise no acute airspace disease.   LABORATORY DATA:  First set of cardiac biomarkers are normal reassuring.  White count 5.9, normal; hemoglobin 9.9; hematocrit 31.5, slightly low;  platelet count 288; and MCV 72, slightly low.  Sodium 141, potassium  3.8, glucose 86, BUN 8, creatinine 0.6, and ionized calcium normal.  TSH  currently pending.  Lipase currently pending.  LFTs currently pending.   ASSESSMENT AND PLAN:  A 50 year old female with newly discovered  hypertension, headache, atypical chest pain, abdominal discomfort, and  depression.  1. Chest pain - fairly atypical.  However, we will continue cycling      cardiac biomarkers.  If normal given her history of somewhat      exertional chest discomfort, we will proceed with further      restratification with stress test in the future.  Hopefully      noncardiac chest pain.  Continue workup.  2. Abdominal discomfort - she is status post cholecystectomy.  Lipase      currently pending.  LFTs currently pending.  She and her husband      both have worry about abdominal aortic aneurysm given her father's      history.  She, I will defer to Dr. Janee Morn for further evaluation.  3. Hypertension - stage II hypertension greater than 160 systolic.  We      will go ahead and initiate olmesartan 20 mg once a day in addition      to amlodipine 5 mg once a day.  4. Headache - constant suboccipital and also at the top of the vertex.      There may be a correlation with hypertension, however, most likely      stress headache or tension headache.  5. Given new onset hypertension and possibility of exophthalmos,      checking TSH.      [name], MD  Electronically  Signed     MCS/MEDQ  D:  09/24/2008  T:  09/25/2008  Job:  [id number]   cc:   Nelva Nay, MD  [name], M.D.

## 2011-04-14 NOTE — H&P (Signed)
NAME:  [name], [name]                   ACCOUNT NO.:  [id number]   MEDICAL RECORD NO.:  [id number]          PATIENT TYPE:  AMB   LOCATION:                                FACILITY:  [facility]   PHYSICIAN:  Guy Sandifer. Henderson Cloud, M.D. DATE OF BIRTH:  1961-09-05   DATE OF ADMISSION:  12/11/2008  DATE OF DISCHARGE:                              HISTORY & PHYSICAL   CHIEF COMPLAINT:  Uterine leiomyomata.   HISTORY OF PRESENT ILLNESS:  This patient is a 50 year old married white  female G2, P2 husband status post vasectomy who has growing leiomyomata.  She has heavy irregular menses with clotting and overflowing her pads.  Ultrasound in the office on December 03, 2008 revealed the uterus  measuring 14.4 x 11.6 x 10.7 cm.  There are multiple leiomyomata ranging  in size from 2.0-6.3 cm.  The ovaries appear normal.  Laparoscopically-  assisted vaginal hysterectomy with bilateral salpingo-oophorectomy and  possible total abdominal hysterectomy with bilateral salpingo-  oophorectomy has been discussed with the patient preoperatively.  All  questions were answered.   PAST MEDICAL HISTORY:  1. History of hypertension.  She was recently taken off of her      antihypertensive medication by her cardiologist, Dr. Anne Fu in      [facility].  2. Anemia.  3. Hiatal hernia.  4. Depression.  5. Sleep apnea.  The patient uses a CPAP machine and was asked to      bring it to the hospital for her postoperative course.  We      discussed the importance of CPAP for the postoperative patient.   PAST SURGICAL HISTORY:  Cholecystectomy in 1985.   OBSTETRICAL HISTORY:  Vaginal delivery x2.   FAMILY HISTORY:  Positive for diabetes and chronic hypertension.   MEDICATIONS:  Lexapro 10 mg daily, Integra iron supplement daily.   ALLERGIES:  Nonsteroidals including NAPROXEN and ASPIRIN leading to  itching, lip swelling, cough and hives.   REVIEW OF SYSTEMS:  NEURO:  Complains of some headache.  CARDIAC:  Denies chest  pain.  PULMONARY:  Denies shortness of breath.  GI: History  of alternating diarrhea and constipation.   PHYSICAL EXAMINATION:  VITAL SIGNS:  Height 5 feet 1-1/2 inch, weight  281.8 pounds, blood pressure 150/92.  HEENT:  Without thyromegaly.  LUNGS:  Clear to auscultation.  HEART:  Regular rate and rhythm.  BACK:  Without CVA tenderness.  BREASTS:  Without mass, retraction or discharge.  ABDOMEN:  Morbidly obese, soft, nontender without palpable masses.  PELVIC:  Vulva, vagina, and cervix without lesion.  Pelvic exam is  compromised secondary to patient habitus.  EXTREMITIES:  Grossly within normal limits.  NEUROLOGICAL:  Grossly within normal limits.   ASSESSMENT:  Uterine leiomyomata and secondary menometrorrhagia.   PLAN:  Laparoscopically-assisted vaginal hysterectomy with bilateral  salpingo-oophorectomy, possible total abdominal hysterectomy with  bilateral salpingo-oophorectomy.      Guy Sandifer Henderson Cloud, M.D.  Electronically Signed     JET/MEDQ  D:  12/03/2008  T:  12/04/2008  Job:  [id number]

## 2011-04-17 NOTE — H&P (Signed)
[facility]  Patient:    [name], [name]                            MRN: [id number] Adm. Date:  01/28/01 Attending:  Guy Sandifer. Arleta Creek, M.D.                         History and Physical  CHIEF COMPLAINT:  Heavy regular bleeding.  HISTORY OF PRESENT ILLNESS:  This patient is a 50 year old married white female G2, P2.  Husband is status post vasectomy who has a light bloody discharge essentially every day.  Menses continue to be quite painful.  Ultrasound with sonohistogram on January 04, 2001, reveals a uterus measuring 10.8 x 6.8 x 6.3 cm which is unchanged from 1999.  There is at least 1 small subserosal fibroid measuring 3.8 cm.  Ovaries are normal.  Sonohistogram is consistent with a 1.5 cm endometrial mass possibly a polyp.  This was discussed with the patient.  Recommendations are for hysteroscopy, D&C were made.  In spite of the patients continued complaints of sometimes severe dysmenorrhea, I do not recommend a laparoscopy at this time secondary to the patients morbid obesity.  Laparoscopy which was done in 1997 was extremely difficult secondary to the patients habitus.  This would highly compromise the potential benefit of the laparoscopy and significantly increase the morbidity.  Possible medical treatments for potential endometriosis will be discussed postoperatively.  All questions have been answered.  PAST MEDICAL HISTORY: 1. Chronic diarrhea in the past. 2. Kidney stone in the past. 3. Superficial varicosities lower extremities. 4. Lime disease 1990. 5. Dysphagia in the past. 6. Hyperlipidemia.  PAST SURGICAL HISTORY: 1. Cholecystectomy in 1985. 2. Laparoscopy/hysteroscopy, D&C with benign findings, in 1997.  FAMILY HISTORY:  Positive for spina bifida in first cousin.  Diabetes in mother and maternal grandmother.  Chronic hypertension in both parents. Coronary artery disease, aneurysms, peripheral vascular disease in  father. Emphysema.  MEDICATIONS:  Questran daily.  ALLERGIES:  No known drug allergies.  OBSTETRIC HISTORY:  Vaginal delivery x 2.  REVIEW OF SYSTEMS:  Negative except as above.  PHYSICAL EXAMINATION:  Height 5 feet 1-1/2 inches.  Weight 282 pounds.  VITAL SIGNS:  Blood pressure 140/90.  HEENT:  Without thyromegaly.  LUNGS:  Clear to auscultation.  HEART:  Regular rate and rhythm.  BACK:  Without CVA tenderness.  BREASTS:   Without mass or discharge.  ABDOMEN:  Morbidly obese.  Soft, nontender without palpable masses.  PELVIC:  Vulva, vagina, cervix without lesion.  Pelvic exam essentially totally compromised by habitus.  EXTREMITIES/ NEUROLOGIC:  Grossly within normal limits.  ASSESSMENT:  Abnormal uterine bleeding.  PLAN:  Hysteroscopy, D&C. DD:  01/25/01 TD:  01/25/01 Job: [id number]

## 2011-04-17 NOTE — Op Note (Signed)
[facility]  Patient:    [name], [name]                          MRN: [id number] Proc. Date: 01/28/01 Adm. Date:  [id number] Attending:  Cordelia Pen Ii                           Operative Report  PREOPERATIVE DIAGNOSIS:       Abnormal uterine bleeding.  POSTOPERATIVE DIAGNOSIS:      Abnormal uterine bleeding.  OPERATION:                    Hysteroscopy, dilatation and curettage.  SURGEON:                      Guy Sandifer. Arleta Creek, M.D.  ANESTHESIA:                   General with LMA.  ESTIMATED BLOOD LOSS:         Drops.  INTAKE AND OUTPUT WITH SORBITOL DISTENDING MEDIA:    40 cc deficit.  INDICATIONS AND CONSENT:      This patient is a 50 year old married white female, G2, P2, husband status post vasectomy, with abnormal uterine bleeding. Details are dictated in history and physical. Hysteroscopy with D&C is discussed with the patient. Possible risks, complications are discussed including, but not limited to, infection, bowel, bladder, ureteral damage, bleeding requiring transfusion of blood products with possible transfusion reaction, HIV, and hepatitis acquisition, DVT, PE, pneumonia, and hysterectomy. All questions are answered and consent is signed on the chart.  FINDINGS:                     Visualization of the endometrial canal is extremely compromised secondary to patient habitus creating great difficulty advancing the hysteroscope. The lower endometrial canal is visualized.  DESCRIPTION OF PROCEDURE:     The patient is taken to the operating room, placed in the dorsal supine position where general anesthesia in induced via LMA. She is then placed in the dorsal lithotomy position and prepped, bladder straight catheterized, and draped in a sterile fashion. Bivalve speculum is placed in the vagina. The anterior cervical lip is grasped with a single-tooth tenaculum and the cervix is gently progressively dilated to a 29  Pratt dilator. Diagnostic hysteroscope is placed and advanced under direct visualization using Sorbitol distending media. The above findings are noted. Hysteroscope is withdrawn. Sharp curettage is carried out for endometrium and at least one polyp. Hysteroscope is replaced and advanced again and again. Visualization is extremely compromised secondary to patient habitus. Hysteroscope is removed. The tenaculum is removed. No bleeding is noted. All counts are correct. The patient is awakened and taken to the recovery room in stable condition. DD:  01/28/01 TD:  01/29/01 Job: [id number]

## 2011-07-03 ENCOUNTER — Observation Stay (HOSPITAL_COMMUNITY)
Admission: EM | Admit: 2011-07-03 | Discharge: 2011-07-04 | Disposition: A | Discharge status: Home / Self Care | Payer: BC Managed Care – PPO | Attending: Surgery | Admitting: Surgery

## 2011-07-03 DIAGNOSIS — G4730 Sleep apnea, unspecified: Secondary | ICD-10-CM | POA: Insufficient documentation

## 2011-07-03 DIAGNOSIS — K219 Gastro-esophageal reflux disease without esophagitis: Secondary | ICD-10-CM | POA: Insufficient documentation

## 2011-07-03 DIAGNOSIS — I10 Essential (primary) hypertension: Secondary | ICD-10-CM | POA: Insufficient documentation

## 2011-07-03 DIAGNOSIS — K612 Anorectal abscess: Principal | ICD-10-CM | POA: Insufficient documentation

## 2011-07-03 DIAGNOSIS — Z79899 Other long term (current) drug therapy: Secondary | ICD-10-CM | POA: Insufficient documentation

## 2011-07-03 HISTORY — PX: INCISION AND DRAINAGE PERIRECTAL ABSCESS: SHX1804

## 2011-07-03 LAB — COMPREHENSIVE METABOLIC PANEL
ALT: 9 U/L (ref 0–35)
AST: 15 U/L (ref 0–37)
CO2: 27 mEq/L (ref 19–32)
Calcium: 9.8 mg/dL (ref 8.4–10.5)
Chloride: 102 mEq/L (ref 96–112)
Creatinine, Ser: 0.65 mg/dL (ref 0.50–1.10)
GFR calc Af Amer: >60 mL/min (ref >60)
GFR calc non Af Amer: >60 mL/min (ref >60)
Glucose, Bld: 105 mg/dL — ABNORMAL HIGH (ref 70–99)
Total Bilirubin: 0.4 mg/dL (ref 0.3–1.2)

## 2011-07-03 LAB — CBC
Hemoglobin: 13.8 g/dL (ref 12.0–15.0)
MCH: 28.1 pg (ref 26.0–34.0)
MCV: 86.4 fL (ref 78.0–100.0)
Platelets: 295 10*3/uL (ref 150–400)
RBC: 4.91 MIL/uL (ref 3.87–5.11)
WBC: 15.3 10*3/uL — ABNORMAL HIGH (ref 4.0–10.5)

## 2011-07-03 LAB — DIFFERENTIAL
Lymphocytes Relative: 7 % — ABNORMAL LOW (ref 12–46)
Lymphs Abs: 1.1 10*3/uL (ref 0.7–4.0)
Monocytes Relative: 6 % (ref 3–12)
Neutro Abs: 13 10*3/uL — ABNORMAL HIGH (ref 1.7–7.7)
Neutrophils Relative %: 85 % — ABNORMAL HIGH (ref 43–77)

## 2011-07-06 ENCOUNTER — Encounter (INDEPENDENT_AMBULATORY_CARE_PROVIDER_SITE_OTHER): Payer: Self-pay | Admitting: General Surgery

## 2011-07-06 ENCOUNTER — Ambulatory Visit (INDEPENDENT_AMBULATORY_CARE_PROVIDER_SITE_OTHER): Payer: BC Managed Care – PPO | Admitting: General Surgery

## 2011-07-06 VITALS — BP 132/88 | HR 60 | Temp 97.2°F | Ht 61.5 in | Wt 298.0 lb

## 2011-07-06 DIAGNOSIS — K611 Rectal abscess: Secondary | ICD-10-CM

## 2011-07-06 DIAGNOSIS — K612 Anorectal abscess: Secondary | ICD-10-CM

## 2011-07-06 LAB — CULTURE, ROUTINE-ABSCESS

## 2011-07-06 NOTE — Patient Instructions (Signed)
Switched to Augmentin 875 t.i.d. for 2-3 days and then decrease to b.i.d. return for followup appointment on Friday and should be fine for her to get to the beach in her upcoming trip. Continue with the soak in 2-3 times a day as presently doing

## 2011-07-06 NOTE — Progress Notes (Signed)
Subjective:     Patient ID: [name], female   DOB: Jan 28, 1961, 50 y.o.   MRN: [id number]  HPIMrs. ham was deferred but was arms Friday and had a very large perirectal abscess posterior that the to the operating room she's 295 pounds and with general anesthesia I could not find a posterior fistula 9 admission but the abscess cavity was closed to the posterior sphincter area I cultured the area packed it we kept her overnight on Zosyn and she asked to return to the office today on exam she is greatly improved abscesses deep but on rectal examination I find no evidence of any perianal fluctuation or problems. I have discussed with both she and her husband about a fistula 9 but this is a primary abscess and hopefully she's not have a fistula developing afterwards since she is growing enterococcus as the primary organism I like to put her on penicillin she's not allergic to and she was given a prescription for age 24 tablets will give her 3 times a day for approximately 2-3 days and then drop and b.i.d. she says she gets vaginal infection when she is on antibiotics and were gallbladder pickups him Mycostatin suppositories she may need of Diflucan later on.   Review of Systems     Objective:   Physical ExamSee above noted there is no obvious intrapleural perianal fluctuation mass I could see that erythemas greatly decreased and the cavity is opened and she soak and as instructed.     Assessment:     With switcher antibiotics I'll see her back on Friday    Plan:    Patient is improving after drainage of a very large perirectal abscess that gets to the posterior sphincter area but no fistula been identified

## 2011-07-08 LAB — ANAEROBIC CULTURE

## 2011-07-10 ENCOUNTER — Ambulatory Visit (INDEPENDENT_AMBULATORY_CARE_PROVIDER_SITE_OTHER): Payer: BC Managed Care – PPO | Admitting: General Surgery

## 2011-07-10 VITALS — BP 146/88 | HR 64 | Temp 97.5°F

## 2011-07-10 DIAGNOSIS — K611 Rectal abscess: Secondary | ICD-10-CM

## 2011-07-10 DIAGNOSIS — K612 Anorectal abscess: Secondary | ICD-10-CM

## 2011-07-10 DIAGNOSIS — D72829 Elevated white blood cell count, unspecified: Secondary | ICD-10-CM

## 2011-07-10 LAB — CBC
Hemoglobin: 12.6 g/dL (ref 12.0–15.0)
MCH: 28.6 pg (ref 26.0–34.0)
MCHC: 33.1 g/dL (ref 30.0–36.0)
Platelets: 280 10*3/uL (ref 150–400)
RDW: 13.6 % (ref 11.5–15.5)

## 2011-07-10 NOTE — Progress Notes (Signed)
Subjective:     Patient ID: [name], female   DOB: 25-Mar-1961, 50 y.o.   MRN: [id number]  HPI The patient stated she had a low-grade temperature her last night and her husband question whether there was a little more drainage her incision looks great no surrounding erythema rectal exam shows no spasm and on anoscopic exam I see no evidence of drainage Posteriorly he has a very deep one and that tracks down to the sphincter and I expect his perirectal abscess started from the posterior dentate line hopefully she's not going developed a recurrent fistula in an  Review of Systems     Objective:   Physical ExamRectal exam shows no evidence of any peri-anal tenderness full and no purulence from the incision at present greatly improved from her preoperative exam one week earlier     Assessment:    I sent her for a repeat followup white blood count which was 7000 and she was 16,000 preoperatively her cultures are consistent with a fair rectal abscess multiple organisms and she is on Augmentin 875 b.i.d.    Plan:    Continue the antibiotics and shower in tub soak and return to see me in one week. Patient was called and informed of her normal white blood count

## 2011-07-10 NOTE — Patient Instructions (Signed)
ContinueThe antibiotics and continue the washing of the peri-rectal area and see me in one week Will check white blood cell count today. I will call use results thisafternoon

## 2011-07-15 ENCOUNTER — Ambulatory Visit (INDEPENDENT_AMBULATORY_CARE_PROVIDER_SITE_OTHER): Payer: BC Managed Care – PPO | Admitting: General Surgery

## 2011-07-15 VITALS — Temp 97.5°F

## 2011-07-15 DIAGNOSIS — K611 Rectal abscess: Secondary | ICD-10-CM

## 2011-07-15 DIAGNOSIS — K612 Anorectal abscess: Secondary | ICD-10-CM

## 2011-07-15 NOTE — Discharge Summary (Signed)
NAMESOUMYA, COLSON                   ACCOUNT NO.:  [id number]  MEDICAL RECORD NO.:  [id number]  LOCATION:  1523                         FACILITY:  [facility]  PHYSICIAN:  Anselm Pancoast. Epiphany Seltzer, M.D.DATE OF BIRTH:  1961-01-18  DATE OF ADMISSION:  07/03/2011 DATE OF DISCHARGE:  07/04/2011                              DISCHARGE SUMMARY   DISCHARGING DIAGNOSES: 1. Large perirectal abscess. 2. Exogenous obesity. 3. History of hypertension.  OPERATION:  Drainage of large posterior perirectal abscess, no internal fistula noted.  HISTORY:  Tiffeny Minchew is a 50 year old approximately 294-pound female who was called on Friday after seeing Dr. Zachery Dauer in [facility] with a large perirectal abscess.  The patient stated that she started having pain the weekend before, was seen in the walk-in clinic in the [place] on Wednesday and given IV or IM antibiotics and started on Septra, but then with worsening pain and swelling, she was re-seen on Friday and they thought that the abscess was too large to try to drain in our office. She was sent to the emergency room and I saw the patient after we had seen her and she has a very large posterior abscess.  On rectal exam, it appears to be a little fullness and I recommended that we taken to the operating room.  She has some paperwork that says she had a difficult intubation following the procedure at [facility] and I talked with Dr. Okey Dupre and he felt that she could do a satisfactory and she had not had anything to drink since approximately 8 a.m.  Therefore, about 4:30, we took her to the operating room and with general anesthesia and in a lithotomy position after Glydescope being used to place endotracheal tube.  This abscess was drained, it is a very large abscess.  It goes up to the posterior sphincter area, but on endoscopic evaluation, etc., I could not find an internal communication.  The multiple loculations were broken up, the wound was packed with  iodoform gauze, soaked in Betadine and she tolerated procedure nicely.  There was a little better drainage on the dressings, but I have change the dressing this morning.  On Saturday morning, the patient can be discharged at this time.  I do not find any areas of fluctuation or tenderness.  The cultures so far as growing gram stain shows gram-positive organism with some gram-negative rods and I am going to let her continue on the Septra b.i.d. that she is already on plus add Keflex and following the office on Monday and make an adjustment if needed on the antibiotics at that time.  I think it is unlikely that this will be a MRSA abscess, but it was a very large abscess and for that particular reason, I am going to let her continue the Septra, but I think the Keflex will probably the better choice of antibiotics.  She has got Vicodin, which we will give her for pain.  She will start soaking in the shower or tub this afternoon.  Her husband was present when I have changed the dressing and he is aware that if she would have any active bleeding that they can use a  little gauze placed in the wound, but hopefully that will be not be necessary and there is no active bleeding now.  She is discharged in improved condition and has Vicodin for pain.  We will continue on her antihypertensive medications.     Anselm Pancoast. Zachery Dakins, M.D.     WJW/MEDQ  D:  07/04/2011  T:  07/05/2011  Job:  [id number]  cc:   Dossie Der, MD Fax: [phone]  Electronically Signed by Consuello Bossier M.D. on 07/15/2011 09:19:32 AM

## 2011-07-15 NOTE — Patient Instructions (Signed)
Continue this tub soaks twice a day and see me in 3 weeks

## 2011-07-15 NOTE — Progress Notes (Signed)
Subjective:     Patient ID: [name], female   DOB: September 15, 1961, 50 y.o.   MRN: [id number]  HPIMs. Hamrick turns in approximately 2 weeks following a drainage but large perirectal abscess and we've not noted any communication with the anus but I think it originated posteriorly the abscess cavity is clean and closing down nicely and all rectal examination is no evidence of any fluctuation pain she says her pain is much improved and she's completed her Augmentin. She is going to the beach with her family are not like to see her in followup in approximately 3 weeks. I gave her prescription for Augmentin in case she would need to restart the antibiotics but did not advise her to get it filled at this time  Review of Systems     Objective:   Physical Exam     Assessment:         Plan:

## 2011-07-15 NOTE — Op Note (Signed)
[name], [name]                   ACCOUNT NO.:  [id number]  MEDICAL RECORD NO.:  [id number]  LOCATION:  1523                         FACILITY:  [facility]  PHYSICIAN:  Anselm Pancoast. Alya Smaltz, M.D.DATE OF BIRTH:  July 21, 1961  DATE OF PROCEDURE:  07/03/2011 DATE OF DISCHARGE:                              OPERATIVE REPORT   PREOPERATIVE DIAGNOSIS:  Large perirectal abscess, posterior right side.  POSTOPERATIVE DIAGNOSIS:  Large perirectal abscess with track into the posterior sphincter, but no internal sphincter or fistula in ano identified.  OPERATION:  Drainage of complex superficial perirectal abscess and culture.  HISTORY:  [name] is a 50 year old overweight Caucasian female who stated that last weekend she started having pain around her anus, could not see or feel anything and then about Wednesday she that she had a tender area and saw her medical physician and was given a shot of IM ampicillin I think  and then released on antibiotics.  She returned today having more swelling and pain and was seen by Dr. Zachery Dauer, who examined her and noted that she now had a 6 cm x 8 cm red fluctuant area posterior and they called and thought this was definitely going to need to be drained in the OR because of its size.  Patient was sent to the ER, but was seen by the ER physician; hence, we were ultimately notified .  On examination, this is a big fluctuant area.  It is not around the medial posterior area, but to the right side really at the pilonidal area, but closer to the anus, a big area of cellulitis of fluctuation with no area where was actually drained externally.  On rectal examination, you could feel fullness posterior and I recommended that we drain this in the operative room.  The patient states at [facility] hospital, she had been told that she was difficult of intubation and I talked with Dr. Okey Dupre, who is the anesthesiologist and he said that since she had been now 6 hours since she  had eaten anything,  she had breakfast at 8 o'clock that he thought that it was safe to proceed, but he did want to do an endotracheal tube and not an LMA tube.  Patient was positioned on the OR table, yellow fin stirrups were available.  She was not up in the stirrups and then she was intubated with the slide of guide and this was intubated nicely.  She was given 3 g of Unasyn and then placed up in the yellow fin stirrups and she is slipped down on the table.  In this position, this area of fluctuation and redness is easily visualized, but it is little more posterior and at first after prep with Betadine solution I used an anoscope, I could not see any evidence of anything draining into the anal canal  with  a sheet under the buttocks in the stirrups and individual drapes in the center.  Then, I made a little triangle incision with the frank purulence of foul smelling gush forth.  This was cultured aerobic and anaerobically.  Placing a finger in the abscess multiple loculations were broken up and then you could drop hemostat  that does go right to the posterior sphincter area, but I could not actually feel a definite opening.  I had used an anoscope and I used a Shepard so we actually identify a definite posterior opening and then I injected the area with Betadine solution externally and could not see a communication.  I then made sure of all the various loculations were broken up.  I did not feel anything that was actually in the ischiorectal rectal area and I made the little skin opening that is probably about 3.3 cm and then packed this cavity with 1 inch Iodoform gauze with a little extra  Betadine in place.  We then put 4 x 4s and the stretch panties in place and the surgery terminated and the patient was extubated.  She awoke without any problems.  We can get better or keep her overnight for IV antibiotics.  She has had fever. She has got a markedly elevated white count. The glucose  is not elevated and it was 105.  She is not a diabetic and she does not have any increased risk of problems with the exception __________ not seen the results of that.  She has been on Septra and whether she continues on Septra or amoxicillin.  We will see what the preliminary cultures showed.  She is on antihypertensive medications and hopefully to be discharged tomorrow and followed up in the office.  I have discussed with the patient's husband about the possibility of fistula-in-ano developing and hoped that this will not occur  and we will follow her postoperatively.     Anselm Pancoast. Zachery Dakins, M.D.     WJW/MEDQ  D:  07/03/2011  T:  07/04/2011  Job:  [id number]  cc:   Dr. Juluis Rainier  Electronically Signed by Consuello Bossier M.D. on 07/15/2011 09:19:36 AM

## 2011-08-10 ENCOUNTER — Encounter (INDEPENDENT_AMBULATORY_CARE_PROVIDER_SITE_OTHER): Payer: Self-pay | Admitting: General Surgery

## 2011-08-11 ENCOUNTER — Ambulatory Visit (INDEPENDENT_AMBULATORY_CARE_PROVIDER_SITE_OTHER): Payer: BC Managed Care – PPO | Admitting: General Surgery

## 2011-08-11 ENCOUNTER — Encounter (INDEPENDENT_AMBULATORY_CARE_PROVIDER_SITE_OTHER): Payer: Self-pay | Admitting: General Surgery

## 2011-08-11 VITALS — BP 120/74 | HR 72 | Temp 97.6°F | Ht 61.5 in | Wt 294.2 lb

## 2011-08-11 DIAGNOSIS — K612 Anorectal abscess: Secondary | ICD-10-CM

## 2011-08-11 DIAGNOSIS — K611 Rectal abscess: Secondary | ICD-10-CM

## 2011-08-11 NOTE — Progress Notes (Signed)
Subjective:     Patient ID: NYLANI MICHETTI, female   DOB: 25-Dec-1960, 50 y.o.   MRN: [id number]  HPIHis hand returns in approximately 6 weeks after her salt and when she had a very large perirectal abscess drained on August 30. She had increasing pain and swelling in the perirectal area for about 10 days when the abscess was driving it tracked down into the posterior sphincter area and I could not identify a benign lymph assisted her with her dressing changes and on exam today the wound size area is still granulating but no drainage but the tract a large abscess cavity is completely obliterated. On anoscopic exam I find no evidence of any internal drainage with probable that she is not going to go this far in a fistula in ano.   Review of Systems  Patient has been off antibiotics for approximately 3 weeks    Objective:   Physical Exam     Assessment:    Nice postoperative course following drainage of a very large posterior complex perirectal abscess that originally formed from a posterior anal area there is no abscess or fistula and a present at this time     Plan:     Final post up visit in approximately 3 weeks patient understands that if she has similar increase in pain or swelling in the perirectal area that she is to be seen in our office promptly by the or the urgent office physician.

## 2011-08-11 NOTE — Patient Instructions (Signed)
See me for final postop visit in approximately 3 weeks if you're having increasing pain and perirectal or abscess area call he seemed prompt either me or her urgent office

## 2011-08-13 NOTE — H&P (Signed)
[name], [name]                   ACCOUNT NO.:  [id number]  MEDICAL RECORD NO.:  [id number]  LOCATION:  WLED                         FACILITY:  [facility]  PHYSICIAN:  Anselm Pancoast. Zachery Dakins, M.D.DATE OF BIRTH:  01/13/1961  DATE OF ADMISSION:  07/03/2011 DATE OF DISCHARGE:                             HISTORY & PHYSICAL   REFERRING PHYSICIAN:  Dossie Der, MD  PRIMARY CARE DOCTOR:  Dossie Der, MD  OB-GYN:  Guy Sandifer. Henderson Cloud, MD  REASON FOR ADMISSION:  Perirectal abscess.  BRIEF HISTORY:  The patient is a 50 year old white female who started having some discomfort around her rectum last "Sunday.  It became worse; she developed fever on Monday and Tuesday, and went to see her primary care at [facility] on Wednesday.  They treated her with Ancef and Bactrim, and scheduled her for followup today.  She was seen by Dr. Barnes.  She continues to have an elevated white count.  The abscess appears to be much larger and her white count was elevated.  Dr. Barnes contacted our office and she was referred to the ER at [facility] for us to evaluate.  Currently, the patient has low-grade fever; her rectum is very tender on the right side at about the 3 o'clock position with an obvious abscess pointing to the rectum.  It does not look like it goes into the rectum.  PAST MEDICAL HISTORY: 1. Hypertension. 2. History of anemia. 3. History of hiatal hernia and GERD. 4. Depression. 5. Sleep apnea, on CPAP. 6. Morbid obesity with a BMI of 55.9. 7. History of chest pain 08/2008 with negative workup by Dr. Skains of     Cardiology. 8. Questionable history of asthma. 9. Questionable history of dyslipidemia. 10.History of nephrolithiasis on two prior occasions. 11.She has a family history of abdominal aortic aneurysm; she     underwent a CT October 2009 which showed no aneurysms but some mild     splenomegaly. 12.History of umbilical hernia. 13.There is a question of  fibromyalgia.  PAST SURGICAL HISTORY: 1. Abdominal hysterectomy with bilateral salpingo-oophorectomy January     20" 10. 2. Cholecystectomy 1985. 3. Tonsils and adenoids.  FAMILY HISTORY:  Father positive for abdominal aortic aneurysm.  Mother is living at age 45 with diabetes and Alzheimer's.  The patient is the primary caretaker.  Father is deceased with aneurysm and atherosclerosis.  No brothers.  One sister in good health.  SOCIAL HISTORY:  Tobacco:  None.  Alcohol:  Occasional.  Drugs:  None. She was a Geophysicist/field seismologist at [employer], now she cares for her mom and her grandchildren.  She is married; her husband is with her.  REVIEW OF SYSTEMS:  CONSTITUTIONAL:  Positive for fever in the last few days.  Weight:  No changes.  CVS: No history of stroke, seizure, syncope or presyncope.  HEART:  No recent chest pain or palpitations.  SKIN:  No changes she is aware of.  PULMONARY:  Some dyspnea on exertion.  No coughing or wheezing or upper respiratory issues.  GI: Positive for GERD.  No nausea or vomiting.  She has had diarrhea status post cholecystectomy, but this has been  improving.  No constipation.  No blood in her stool.  GU: No trouble voiding.  LOWER EXTREMITIES:  She has occasional edema.  She has some calf pain occasionally on the right which is more like a sprain.  She has joint problems, says her knees swell, and she has muscular aches for which it is really time to consider fibromyalgia.  CURRENT MEDICATIONS: 1. Percocet p.r.n. 2. Bactrim DS 1 p.o. b.i.d. 3. Cymbalta 30 mg daily. 4. Lisinopril 20 mg daily. 5. Tylenol p.r.n. 6. EpiPen p.r.n. 7. Calcium 600 mg b.i.d. 8. Multivitamin daily.  ALLERGIES:  ASPIRIN causes swelling.  She carries an EpiPen for this.  PHYSICAL EXAM:  GENERAL:  This is a well-nourished, obese white female in no acute distress.  Weight is 296, height is 61.5 inches; BMI is 55.9.  ER VITAL SIGNS:  Temperature 96.2, heart rate 91, blood  pressure 136/63, respiratory rate 16, saturation 100% on room air. HEENT:  Head is normocephalic.  Eyes, ears, nose and throat are all within normal limits. NECK:  Trachea is in the midline.  No bruits.  No JVD.  No thyromegaly. CHEST:  Clear to auscultation and percussion, nontender.  No wheezes or rales. HEART:  Normal S1 and S2.  No murmurs or rubs. ABDOMEN:  Soft, nontender.  Positive bowel sounds.  No palpable hepatosplenomegaly; she is lying on the side, it is hard to tell.  She has an umbilical hernia, which is palpable.  No abscesses or masses. GENITOURINARY:  Deferred. RECTAL:  Large 2-cm abscess about the 3 o'clock position on the right buttock and it appears ready to drain. LYMPH NODE SURVEY:  Lymphadenopathy:  None palpated, cervical, axillary or femoral. MUSCULOSKELETAL:  No changes. SKIN:  Red and tender abscess as noted above. NEUROLOGIC:  No focal deficits.  Cranial nerves grossly intact II-XII. PSYCHIATRIC:  Normal affect.  LABORATORY DATA:  Her white count in the ER was 15.3, hemoglobin 13.8, hematocrit 42.4, platelets 295,000.  Sodium is 138, potassium is 4.2, chloride  is 102, CO2 is 27, BUN 12, creatinine 0.7, glucose is 105.  DIAGNOSTICS:  None.  IMPRESSION: 1. Perirectal abscess. 2. Hypertension. 3. Morbid obesity with body mass index of 55.9. 4. History of gastroesophageal reflux disease, hiatal hernia. 5. History of depression, being treated. 6. Sleep apnea with CPAP. 7. History of chest pain with negative workup 2009. 8. Umbilical hernia.  PLAN:  The patient will be seen and evaluated by Dr. Zachery Dakins.  We will plan incision and drainage.  She had problems with intubation in the past, so we may first see if we can do this under local and, if not, take her to the OR for incision and drainage.  Dictated For:  Anselm Pancoast. Zachery Dakins, MD     Eber Hong, P.A.   ______________________________ Anselm Pancoast. Zachery Dakins, M.D.    WDJ/MEDQ  D:   07/03/2011  T:  07/03/2011  Job:  [id number]  cc:   Guy Sandifer. Henderson Cloud, M.D. Fax: [phone]  [name], MD Fax: [phone]  Juluis Rainier, M.D. Fax: [phone]  Electronically Signed by Sherrie George P.A. on 07/21/2011 06:30:13 PM Electronically Signed by Consuello Bossier M.D. on 08/13/2011 08:16:40 AM

## 2011-09-01 LAB — CK TOTAL AND CKMB (NOT AT ARMC)
Relative Index: INVALID
Total CK: 42

## 2011-09-01 LAB — CARDIAC PANEL(CRET KIN+CKTOT+MB+TROPI)
CK, MB: 0.9
CK, MB: 1.1
Relative Index: INVALID
Relative Index: INVALID
Total CK: 33
Troponin I: <0.01

## 2011-09-01 LAB — LIPID PANEL
LDL Cholesterol: 86
Triglycerides: 197 — ABNORMAL HIGH

## 2011-09-01 LAB — CBC
MCHC: 31.7
Platelets: 288
RBC: 4.15
RBC: 4.37
RDW: 17.4 — ABNORMAL HIGH
WBC: 5.9

## 2011-09-01 LAB — LIPASE, BLOOD: Lipase: 17

## 2011-09-01 LAB — POCT I-STAT, CHEM 8
BUN: 8
Chloride: 104
Creatinine, Ser: 0.6
Potassium: 3.8
Sodium: 141
TCO2: 28

## 2011-09-01 LAB — MAGNESIUM: Magnesium: 2.2

## 2011-09-01 LAB — DIFFERENTIAL
Basophils Absolute: 0
Basophils Relative: 0
Eosinophils Absolute: 0.1
Lymphocytes Relative: 16
Lymphocytes Relative: 16
Lymphs Abs: 0.9
Monocytes Relative: 7
Monocytes Relative: 9
Neutro Abs: 4.3
Neutro Abs: 4.3
Neutrophils Relative %: 74
Neutrophils Relative %: 74

## 2011-09-01 LAB — TROPONIN I: Troponin I: <0.01

## 2011-09-01 LAB — HCG, QUANTITATIVE, PREGNANCY: hCG, Beta Chain, Quant, S: <2

## 2011-09-01 LAB — COMPREHENSIVE METABOLIC PANEL
AST: 16
CO2: 28
Calcium: 8.7
Creatinine, Ser: 0.5
GFR calc Af Amer: >60
GFR calc non Af Amer: >60
Total Protein: 6.4

## 2011-09-01 LAB — B-NATRIURETIC PEPTIDE (CONVERTED LAB): Pro B Natriuretic peptide (BNP): 249 — ABNORMAL HIGH

## 2011-09-01 LAB — BASIC METABOLIC PANEL
Calcium: 8.5
Chloride: 105

## 2011-09-01 LAB — AMYLASE: Amylase: 34

## 2011-09-04 ENCOUNTER — Ambulatory Visit (INDEPENDENT_AMBULATORY_CARE_PROVIDER_SITE_OTHER): Payer: BC Managed Care – PPO | Admitting: General Surgery

## 2011-09-04 ENCOUNTER — Encounter (INDEPENDENT_AMBULATORY_CARE_PROVIDER_SITE_OTHER): Payer: Self-pay | Admitting: General Surgery

## 2011-09-04 VITALS — BP 128/88 | HR 76 | Temp 97.9°F | Resp 20 | Ht 61.5 in | Wt 295.0 lb

## 2011-09-04 DIAGNOSIS — K611 Rectal abscess: Secondary | ICD-10-CM

## 2011-09-04 DIAGNOSIS — K612 Anorectal abscess: Secondary | ICD-10-CM

## 2011-09-04 NOTE — Patient Instructions (Signed)
Try to regulate her stools he ever have constipation. Anterior having any tenderness or fever originating in the peri-rectal anal area return to see [place] promptly.

## 2011-09-04 NOTE — Progress Notes (Signed)
Subjective:     Patient ID: [name], female   DOB: 1961-04-14, 51 y.o.   MRN: [id number]  HPIMs. Pam returns she is allowed to months since I drained very lower or rectal abscess and the cavity has closed at the time of OR I checked very carefully and it tracked to the posterior sphincter area but I was never able to identify a communication to the posterior rectum abscess cavity is closed and at present she's had no symptoms and hopefully he'll have no further problems. She states that she started having pain approximately one week prior to the time that I drained abscess she saw her physician approximately 3 days after she started having pain in the originally placed her on antibiotics as I could not identify an obvious abscess  On examination today I found no evidence of any remaining problems the little exit site has healed nicely the surrounding tissue is soft anoscopic exam I find no evidence of any tenderness or indentation anything to show a fistula ano. The patient is aware that if she hadn't any tenderness in the rectal area of "hemorrhoids" he does e turned to see [place] on a prompt basis as an abscess would require drainage and most likely a fistula ano would be the culprit. Trying to regulate her stool to prevent constipation as the best way to prevent this problem   Review of Systems     Objective:   Physical Exam     Assessment:         Plan:     See [place] if any perianal pain or tenderness

## 2011-11-20 ENCOUNTER — Other Ambulatory Visit (HOSPITAL_COMMUNITY): Payer: Self-pay | Admitting: Gastroenterology

## 2011-12-04 ENCOUNTER — Other Ambulatory Visit (HOSPITAL_COMMUNITY): Payer: BC Managed Care – PPO

## 2012-05-03 ENCOUNTER — Other Ambulatory Visit: Payer: Self-pay | Admitting: Gastroenterology

## 2012-05-03 DIAGNOSIS — R1310 Dysphagia, unspecified: Secondary | ICD-10-CM

## 2012-05-17 ENCOUNTER — Ambulatory Visit
Admission: RE | Admit: 2012-05-17 | Discharge: 2012-05-17 | Disposition: A | Discharge status: Home / Self Care | Payer: BC Managed Care – PPO | Source: Ambulatory Visit | Attending: Gastroenterology | Admitting: Gastroenterology

## 2012-05-17 DIAGNOSIS — R1310 Dysphagia, unspecified: Secondary | ICD-10-CM

## 2012-05-25 ENCOUNTER — Other Ambulatory Visit: Payer: Self-pay | Admitting: Gastroenterology

## 2012-07-25 ENCOUNTER — Other Ambulatory Visit: Payer: Self-pay | Admitting: Gastroenterology

## 2012-07-25 DIAGNOSIS — R109 Unspecified abdominal pain: Secondary | ICD-10-CM

## 2012-08-09 ENCOUNTER — Ambulatory Visit
Admission: RE | Admit: 2012-08-09 | Discharge: 2012-08-09 | Disposition: A | Discharge status: Home / Self Care | Payer: BC Managed Care – PPO | Source: Ambulatory Visit | Attending: Gastroenterology | Admitting: Gastroenterology

## 2012-08-09 DIAGNOSIS — R109 Unspecified abdominal pain: Secondary | ICD-10-CM

## 2012-08-16 ENCOUNTER — Other Ambulatory Visit: Payer: Self-pay | Admitting: Internal Medicine

## 2012-08-16 DIAGNOSIS — R109 Unspecified abdominal pain: Secondary | ICD-10-CM

## 2012-08-18 ENCOUNTER — Inpatient Hospital Stay
Admission: RE | Admit: 2012-08-18 | Discharge: 2012-08-18 | Payer: BC Managed Care – PPO | Source: Ambulatory Visit | Attending: Internal Medicine | Admitting: Internal Medicine

## 2012-08-24 ENCOUNTER — Ambulatory Visit
Admission: RE | Admit: 2012-08-24 | Discharge: 2012-08-24 | Disposition: A | Discharge status: Home / Self Care | Payer: BC Managed Care – PPO | Source: Ambulatory Visit | Attending: Internal Medicine | Admitting: Internal Medicine

## 2012-08-24 DIAGNOSIS — R109 Unspecified abdominal pain: Secondary | ICD-10-CM

## 2012-08-24 MED ORDER — IOHEXOL 300 MG/ML  SOLN
125.0000 mL | Freq: Once | INTRAMUSCULAR | Status: AC | PRN
  Administered 2012-08-24: 125 mL via INTRAVENOUS

## 2013-04-30 DIAGNOSIS — G5602 Carpal tunnel syndrome, left upper limb: Secondary | ICD-10-CM

## 2013-04-30 HISTORY — DX: Carpal tunnel syndrome, left upper limb: G56.02

## 2013-05-01 ENCOUNTER — Other Ambulatory Visit: Payer: Self-pay | Admitting: Orthopedic Surgery

## 2013-05-05 ENCOUNTER — Encounter (HOSPITAL_BASED_OUTPATIENT_CLINIC_OR_DEPARTMENT_OTHER): Payer: Self-pay | Admitting: *Deleted

## 2013-05-05 NOTE — Pre-Procedure Instructions (Signed)
To come for EKG and anesthesia consult.

## 2013-05-08 NOTE — Pre-Procedure Instructions (Signed)
Anesthesia record reviewed by Dr. Ivin Booty; need to have pt. evaluated by anesthesiologist when she comes for pre-op labs.

## 2013-05-10 ENCOUNTER — Encounter (HOSPITAL_BASED_OUTPATIENT_CLINIC_OR_DEPARTMENT_OTHER)
Admission: RE | Admit: 2013-05-10 | Discharge: 2013-05-10 | Disposition: A | Discharge status: Home / Self Care | Payer: BC Managed Care – PPO | Source: Ambulatory Visit | Attending: Orthopedic Surgery | Admitting: Orthopedic Surgery

## 2013-05-10 NOTE — Progress Notes (Signed)
Pt seen by Dr Gelene Mink cleared for surgery in am.  Additional information concerning pt hx.

## 2013-05-10 NOTE — H&P (Signed)
[name] is an 52 y.o. female.   Chief Complaint: c/o chronic and progressive numbness and tingling of the left hand HPI: [name] returns for follow up evaluation of her left trigger thumb and carpal tunnel symptoms. She has been under the astute care of Dr.Angela Hawkes for evaluation of possible polymyositis. On a clinical basis she has significant left carpal tunnel syndrome and some symptoms on the right. Her left trigger thumb has resolved.    Past Medical History  Diagnosis Date  . Esophageal stricture     hx. of esophageal dilation x 1  . Arthritis     knees, right shoulder  . Muscle pain   . Depression     no current med.  . Hypertension     under control with med., has been on med. x 5 yr.  . Sleep apnea     uses CPAP nightly  . Carpal tunnel syndrome of left wrist 04/2013  . History of difficult intubation   . Difficult intubation   . Dental crowns present     Past Surgical History  Procedure Laterality Date  . Tonsillectomy and adenoidectomy    . Hysteroscopy w/d&c  01/28/2001  . Laparoscopic supracervical hysterectomy  12/11/2008  . Bilateral salpingoophorectomy  12/11/2008  . Cystoscopy  12/11/2008  . Incision and drainage perirectal abscess  07/03/2011  . Cholecystectomy  1985  . Esophageal dilation      History reviewed. No pertinent family history. Social History:  reports that she has never smoked. She has never used smokeless tobacco. She reports that  drinks alcohol. She reports that she does not use illicit drugs.  Allergies:  Allergies  Allergen Reactions  . Aspirin Itching and Swelling  . Demerol (Meperidine) Nausea And Vomiting  . Nsaids Itching and Swelling  . Other Nausea Only    PERFUMES CAUSE HEADACHE  . Salicylates Itching and Swelling  . Dilaudid (Hydromorphone Hcl) Itching  . Iohexol Itching         No prescriptions prior to admission    No results found for this or any previous visit (from the past 48 hour(s)).  No results  found.   Pertinent items are noted in HPI.  Height 5' 1.5" (1.562 m), weight 133.811 kg (295 lb).  General appearance: alert Head: Normocephalic, without obvious abnormality Neck: supple, symmetrical, trachea midline Resp: clear to auscultation bilaterally Cardio: regular rate and rhythm GI: normal findings: bowel sounds normal Extremities: Exam of her left hand reveals positive Phalen's and Tinel's. She has FROM of her digits and wrist. NCV testing reveals positive left CTS. Pulses: 2+ and symmetric Skin: normal Neurologic: Grossly normal    Assessment/Plan Impression: Left CTS  Plan: To the OR for left CTR.The procedure, risks,benefits and post-op course were discussed with the patient at length and they were in agreement with the plan.  DASNOIT,Izaak Sahr J 05/10/2013, 2:51 PM    H&P documentation: 05/11/2013  -History and Physical Reviewed  -Patient has been re-examined  -No change in the plan of care  Wyn Forster, MD

## 2013-05-11 ENCOUNTER — Ambulatory Visit (HOSPITAL_BASED_OUTPATIENT_CLINIC_OR_DEPARTMENT_OTHER)
Admission: RE | Admit: 2013-05-11 | Discharge: 2013-05-11 | Disposition: A | Discharge status: Home / Self Care | Payer: BC Managed Care – PPO | Source: Ambulatory Visit | Attending: Orthopedic Surgery | Admitting: Orthopedic Surgery

## 2013-05-11 ENCOUNTER — Encounter (HOSPITAL_BASED_OUTPATIENT_CLINIC_OR_DEPARTMENT_OTHER): Payer: Self-pay | Admitting: Anesthesiology

## 2013-05-11 ENCOUNTER — Encounter (HOSPITAL_BASED_OUTPATIENT_CLINIC_OR_DEPARTMENT_OTHER)
Admission: RE | Disposition: A | Discharge status: Home / Self Care | Payer: Self-pay | Source: Ambulatory Visit | Attending: Orthopedic Surgery

## 2013-05-11 ENCOUNTER — Ambulatory Visit (HOSPITAL_BASED_OUTPATIENT_CLINIC_OR_DEPARTMENT_OTHER): Payer: BC Managed Care – PPO | Admitting: Anesthesiology

## 2013-05-11 DIAGNOSIS — G5600 Carpal tunnel syndrome, unspecified upper limb: Secondary | ICD-10-CM | POA: Insufficient documentation

## 2013-05-11 DIAGNOSIS — I10 Essential (primary) hypertension: Secondary | ICD-10-CM | POA: Insufficient documentation

## 2013-05-11 HISTORY — DX: Major depressive disorder, single episode, unspecified: F32.9

## 2013-05-11 HISTORY — DX: Depression, unspecified: F32.A

## 2013-05-11 HISTORY — DX: Essential (primary) hypertension: I10

## 2013-05-11 HISTORY — DX: Esophageal obstruction: K22.2

## 2013-05-11 HISTORY — DX: Sleep apnea, unspecified: G47.30

## 2013-05-11 HISTORY — DX: Other specified personal risk factors, not elsewhere classified: Z91.89

## 2013-05-11 HISTORY — DX: Failed or difficult intubation, initial encounter: T88.4XXA

## 2013-05-11 HISTORY — DX: Carpal tunnel syndrome, left upper limb: G56.02

## 2013-05-11 HISTORY — DX: Myalgia, unspecified site: M79.10

## 2013-05-11 HISTORY — PX: CARPAL TUNNEL RELEASE: SHX101

## 2013-05-11 HISTORY — DX: Dental restoration status: Z98.811

## 2013-05-11 LAB — POCT I-STAT, CHEM 8
Creatinine, Ser: 0.6 mg/dL (ref 0.50–1.10)
HCT: 39 % (ref 36.0–46.0)
Hemoglobin: 13.3 g/dL (ref 12.0–15.0)
Sodium: 142 mEq/L (ref 135–145)
TCO2: 23 mmol/L (ref 0–100)

## 2013-05-11 SURGERY — CARPAL TUNNEL RELEASE
Anesthesia: General | Site: Wrist | Laterality: Left | Wound class: Clean

## 2013-05-11 MED ORDER — LIDOCAINE HCL 2 % IJ SOLN
INTRAMUSCULAR | Status: DC | PRN
  Administered 2013-05-11: 4 mL via INTRADERMAL

## 2013-05-11 MED ORDER — FENTANYL CITRATE 0.05 MG/ML IJ SOLN: 50.0000 ug | INTRAMUSCULAR | Status: DC | PRN

## 2013-05-11 MED ORDER — HYDROCODONE-ACETAMINOPHEN 5-325 MG PO TABS: 1.0000 | ORAL_TABLET | ORAL | Status: DC | PRN

## 2013-05-11 MED ORDER — TRAMADOL HCL 50 MG PO TABS: 50.0000 mg | ORAL_TABLET | Freq: Four times a day (QID) | ORAL | Status: DC | PRN

## 2013-05-11 MED ORDER — PROPOFOL 10 MG/ML IV BOLUS
INTRAVENOUS | Status: DC | PRN
  Administered 2013-05-11: 200 mg via INTRAVENOUS
  Administered 2013-05-11: 20 mg via INTRAVENOUS

## 2013-05-11 MED ORDER — LACTATED RINGERS IV SOLN
INTRAVENOUS | Status: DC
  Administered 2013-05-11 (×2): via INTRAVENOUS

## 2013-05-11 MED ORDER — FENTANYL CITRATE 0.05 MG/ML IJ SOLN
INTRAMUSCULAR | Status: DC | PRN
  Administered 2013-05-11: 50 ug via INTRAVENOUS

## 2013-05-11 MED ORDER — CHLORHEXIDINE GLUCONATE 4 % EX LIQD: 60.0000 mL | Freq: Once | CUTANEOUS | Status: DC

## 2013-05-11 MED ORDER — LIDOCAINE HCL (CARDIAC) 20 MG/ML IV SOLN
INTRAVENOUS | Status: DC | PRN
  Administered 2013-05-11: 60 mg via INTRAVENOUS

## 2013-05-11 MED ORDER — MIDAZOLAM HCL 2 MG/2ML IJ SOLN: 1.0000 mg | INTRAMUSCULAR | Status: DC | PRN

## 2013-05-11 MED ORDER — MIDAZOLAM HCL 5 MG/5ML IJ SOLN
INTRAMUSCULAR | Status: DC | PRN
  Administered 2013-05-11: 1 mg via INTRAVENOUS

## 2013-05-11 MED ORDER — FENTANYL CITRATE 0.05 MG/ML IJ SOLN
25.0000 ug | INTRAMUSCULAR | Status: DC | PRN
  Administered 2013-05-11: 25 ug via INTRAVENOUS
  Administered 2013-05-11: 50 ug via INTRAVENOUS

## 2013-05-11 MED ORDER — METOCLOPRAMIDE HCL 5 MG/ML IJ SOLN: 10.0000 mg | Freq: Once | INTRAMUSCULAR | Status: DC | PRN

## 2013-05-11 MED ORDER — OXYCODONE HCL 5 MG PO TABS: 5.0000 mg | ORAL_TABLET | Freq: Once | ORAL | Status: DC | PRN

## 2013-05-11 MED ORDER — MIDAZOLAM HCL 2 MG/ML PO SYRP: 12.0000 mg | ORAL_SOLUTION | Freq: Once | ORAL | Status: DC | PRN

## 2013-05-11 MED ORDER — ONDANSETRON HCL 4 MG/2ML IJ SOLN
INTRAMUSCULAR | Status: DC | PRN
  Administered 2013-05-11: 4 mg via INTRAVENOUS

## 2013-05-11 MED ORDER — OXYCODONE HCL 5 MG/5ML PO SOLN: 5.0000 mg | Freq: Once | ORAL | Status: DC | PRN

## 2013-05-11 SURGICAL SUPPLY — 40 items
BANDAGE ADHESIVE 1X3 (GAUZE/BANDAGES/DRESSINGS) IMPLANT
BANDAGE ELASTIC 3 VELCRO ST LF (GAUZE/BANDAGES/DRESSINGS) ×2 IMPLANT
BLADE SURG 15 STRL LF DISP TIS (BLADE) ×1 IMPLANT
BLADE SURG 15 STRL SS (BLADE) ×1
BNDG ESMARK 4X9 LF (GAUZE/BANDAGES/DRESSINGS) ×2 IMPLANT
BRUSH SCRUB EZ PLAIN DRY (MISCELLANEOUS) IMPLANT
CLOTH BEACON ORANGE TIMEOUT ST (SAFETY) ×2 IMPLANT
CORDS BIPOLAR (ELECTRODE) ×2 IMPLANT
COVER MAYO STAND STRL (DRAPES) ×2 IMPLANT
COVER TABLE BACK 60X90 (DRAPES) ×2 IMPLANT
CUFF TOURNIQUET SINGLE 18IN (TOURNIQUET CUFF) IMPLANT
CUFF TOURNIQUET SINGLE 24IN (TOURNIQUET CUFF) ×2 IMPLANT
DECANTER SPIKE VIAL GLASS SM (MISCELLANEOUS) IMPLANT
DRAPE EXTREMITY T 121X128X90 (DRAPE) ×2 IMPLANT
DRAPE SURG 17X23 STRL (DRAPES) ×2 IMPLANT
GLOVE BIOGEL M STRL SZ7.5 (GLOVE) ×2 IMPLANT
GLOVE BIOGEL PI IND STRL 6.5 (GLOVE) ×1 IMPLANT
GLOVE BIOGEL PI IND STRL 7.5 (GLOVE) ×1 IMPLANT
GLOVE BIOGEL PI INDICATOR 6.5 (GLOVE) ×1
GLOVE BIOGEL PI INDICATOR 7.5 (GLOVE) ×1
GLOVE EXAM NITRILE LRG STRL (GLOVE) ×2 IMPLANT
GLOVE ORTHO TXT STRL SZ7.5 (GLOVE) ×2 IMPLANT
GOWN BRE IMP PREV XXLGXLNG (GOWN DISPOSABLE) ×2 IMPLANT
GOWN PREVENTION PLUS XLARGE (GOWN DISPOSABLE) ×2 IMPLANT
NEEDLE 27GAX1X1/2 (NEEDLE) ×2 IMPLANT
PACK BASIN DAY SURGERY FS (CUSTOM PROCEDURE TRAY) ×2 IMPLANT
PAD CAST 3X4 CTTN HI CHSV (CAST SUPPLIES) ×1 IMPLANT
PADDING CAST ABS 4INX4YD NS (CAST SUPPLIES) ×1
PADDING CAST ABS COTTON 4X4 ST (CAST SUPPLIES) ×1 IMPLANT
PADDING CAST COTTON 3X4 STRL (CAST SUPPLIES) ×1
SPLINT PLASTER CAST XFAST 3X15 (CAST SUPPLIES) ×5 IMPLANT
SPLINT PLASTER XTRA FASTSET 3X (CAST SUPPLIES) ×5
SPONGE GAUZE 4X4 12PLY (GAUZE/BANDAGES/DRESSINGS) ×2 IMPLANT
STOCKINETTE 4X48 STRL (DRAPES) ×2 IMPLANT
STRIP CLOSURE SKIN 1/2X4 (GAUZE/BANDAGES/DRESSINGS) ×2 IMPLANT
SUT PROLENE 3 0 PS 2 (SUTURE) ×2 IMPLANT
SYR 3ML 23GX1 SAFETY (SYRINGE) IMPLANT
SYR CONTROL 10ML LL (SYRINGE) ×2 IMPLANT
TRAY DSU PREP LF (CUSTOM PROCEDURE TRAY) IMPLANT
UNDERPAD 30X30 INCONTINENT (UNDERPADS AND DIAPERS) ×2 IMPLANT

## 2013-05-11 NOTE — Brief Op Note (Signed)
05/11/2013  8:13 AM  PATIENT:  [name]  52 y.o. female  PRE-OPERATIVE DIAGNOSIS:  LEFT CARPAL TUNNEL SYNDROME  POST-OPERATIVE DIAGNOSIS:  LEFT CARPAL TUNNEL SYNDROME  PROCEDURE:  Procedure(s): CARPAL TUNNEL RELEASE (Left)  SURGEON:  Surgeon(s) and Role:    * Wyn Forster., MD - Primary  PHYSICIAN ASSISTANT:   ASSISTANTS: surgical tech  ANESTHESIA:   general  EBL:  Total I/O In: 400 [I.V.:400] Out: -   BLOOD ADMINISTERED:none  DRAINS: none   LOCAL MEDICATIONS USED:  LIDOCAINE   SPECIMEN:  No Specimen  DISPOSITION OF SPECIMEN:  N/A  COUNTS:  YES  TOURNIQUET:   Total Tourniquet Time Documented: Upper Arm (Left) - 16 minutes Total: Upper Arm (Left) - 16 minutes   DICTATION: .Other Dictation: Dictation Number [id number]  PLAN OF CARE: Discharge to home after PACU  PATIENT DISPOSITION:  PACU - hemodynamically stable.   Delay start of Pharmacological VTE agent (>24hrs) due to surgical blood loss or risk of bleeding: not applicable

## 2013-05-11 NOTE — Anesthesia Postprocedure Evaluation (Signed)
Anesthesia Post Note  Patient: [name]  Procedure(s) Performed: Procedure(s) (LRB): CARPAL TUNNEL RELEASE (Left)  Anesthesia type: General  Patient location: PACU  Post pain: Pain level controlled  Post assessment: Patient's Cardiovascular Status Stable  Last Vitals:  Filed Vitals:   05/11/13 0845  BP: 118/76  Pulse: 73  Temp:   Resp: 14    Post vital signs: Reviewed and stable  Level of consciousness: alert  Complications: No apparent anesthesia complications

## 2013-05-11 NOTE — Anesthesia Procedure Notes (Signed)
Procedure Name: LMA Insertion Date/Time: 05/11/2013 7:37 AM Performed by: Gar Gibbon Pre-anesthesia Checklist: Patient identified, Emergency Drugs available, Suction available and Patient being monitored Patient Re-evaluated:Patient Re-evaluated prior to inductionOxygen Delivery Method: Circle System Utilized Preoxygenation: Pre-oxygenation with 100% oxygen Intubation Type: IV induction Ventilation: Mask ventilation without difficulty LMA: LMA with gastric port inserted LMA Size: 4.0 Number of attempts: 1 Placement Confirmation: positive ETCO2 Tube secured with: Tape Dental Injury: Teeth and Oropharynx as per pre-operative assessment

## 2013-05-11 NOTE — Anesthesia Preprocedure Evaluation (Signed)
Anesthesia Evaluation  Patient identified by MRN, date of birth, ID band Patient awake    Reviewed: Allergy & Precautions, H&P , NPO status , Patient's Chart, lab work & pertinent test results, reviewed documented beta blocker date and time   History of Anesthesia Complications (+) DIFFICULT AIRWAY  Airway Mallampati: II TM Distance: >3 FB Neck ROM: full    Dental   Pulmonary sleep apnea ,  breath sounds clear to auscultation        Cardiovascular hypertension, On Medications Rhythm:regular     Neuro/Psych PSYCHIATRIC DISORDERS  Neuromuscular disease    GI/Hepatic negative GI ROS, Neg liver ROS,   Endo/Other  negative endocrine ROS  Renal/GU negative Renal ROS  negative genitourinary   Musculoskeletal   Abdominal   Peds  Hematology negative hematology ROS (+)   Anesthesia Other Findings See surgeon's H&P   Reproductive/Obstetrics negative OB ROS                           Anesthesia Physical Anesthesia Plan  ASA: III  Anesthesia Plan: General   Post-op Pain Management:    Induction: Intravenous  Airway Management Planned: LMA  Additional Equipment:   Intra-op Plan:   Post-operative Plan:   Informed Consent: I have reviewed the patients History and Physical, chart, labs and discussed the procedure including the risks, benefits and alternatives for the proposed anesthesia with the patient or authorized representative who has indicated his/her understanding and acceptance.   Dental Advisory Given  Plan Discussed with: CRNA and Surgeon  Anesthesia Plan Comments:         Anesthesia Quick Evaluation

## 2013-05-11 NOTE — Transfer of Care (Signed)
Immediate Anesthesia Transfer of Care Note  Patient: [name]  Procedure(s) Performed: Procedure(s): CARPAL TUNNEL RELEASE (Left)  Patient Location: PACU  Anesthesia Type:General  Level of Consciousness: awake, sedated and patient cooperative  Airway & Oxygen Therapy: Patient Spontanous Breathing and Patient connected to face mask oxygen  Post-op Assessment: Report given to PACU RN and Post -op Vital signs reviewed and stable  Post vital signs: Reviewed and stable  Complications: No apparent anesthesia complications

## 2013-05-11 NOTE — Op Note (Signed)
[id number] 

## 2013-05-12 ENCOUNTER — Encounter (HOSPITAL_BASED_OUTPATIENT_CLINIC_OR_DEPARTMENT_OTHER): Payer: Self-pay | Admitting: Orthopedic Surgery

## 2013-05-12 NOTE — Op Note (Signed)
[name], [name]                   ACCOUNT NO.:  [id number]  MEDICAL RECORD NO.:  [id number]  LOCATION:                                 FACILITY:  PHYSICIAN:  Katy Fitch. Lorriann Hansmann, M.D. DATE OF BIRTH:  Jan 26, 1961  DATE OF PROCEDURE:  05/11/2013 DATE OF DISCHARGE:  05/11/2013                              OPERATIVE REPORT   PREOPERATIVE DIAGNOSIS:  Entrapment neuropathy, median nerve, left hand and wrist at carpal tunnel.  POSTOPERATIVE DIAGNOSIS:  Entrapment neuropathy, median nerve, left hand and wrist at carpal tunnel.  OPERATION:  Release of left transverse carpal ligament.  OPERATING SURGEON:  Katy Fitch. Freada Twersky, MD  ASSISTANT:  Surgical technician.  ANESTHESIA:  General by LMA.  SUPERVISING ANESTHESIOLOGIST:  Janetta Hora. Gelene Mink, M.D.  INDICATIONS:  [name] is a 52 year old woman, referred for evaluation and management of hand numbness and shoulder pain.  She has been under the able evaluation of Dr. Zenovia Jordan for musculoskeletal complaints and some laboratory concerns.  Her family is well acquainted with our practice.  She sought evaluation and management of hand numbness, elbow and shoulder pain on the right.  Clinical examination revealed signs of shoulder impingement with AC joint arthropathy and bursitis without a full-thickness rotator cuff tear.  Clinical examination suggested bilateral carpal tunnel syndrome without evidence of neuropathy.  She had detailed electrodiagnostic studies, revealed carpal tunnel syndrome, left worse than right.  We had a long discussion regarding each of her issues and we determined with her guidance which should be approached first.  We ultimately decided to address her left hand numbness initially.  There is a possibility that her right carpal tunnel syndrome is contributing in part to her shoulder level complaints.  She also has pathology in the shoulder that has recently been documented by MRI to account for many of her  proximal arm and shoulder complaints.  After detailed informed consent in the office and in the holding area, she was brought to the operating room at this time, anticipating release of her left transverse carpal ligament.  Questions were invited and answered in detail.  She has multiple drug allergies, but reports that she is able to tolerate hydrocodone as a pain medication.  DESCRIPTION OF PROCEDURE:  [name] was interviewed in the [facility] holding area and her left arm marked as a proper surgical site per the identification protocol.  She had detailed anesthesia informed consent with Dr. Gelene Mink and chose general anesthesia by LMA technique.  In room 1 under Dr. Thornton Dales direct supervision, general anesthesia by LMA technique was induced followed by routine Betadine scrub and paint of the left upper extremity.  A pneumatic tourniquet was applied to the proximal left brachium. Sterile stockinette and impervious arthroscopic drapes were applied. The left hand and arm were exsanguinated with Esmarch bandage and the arterial tourniquet on the proximal arm inflated to 250 mmHg due to the mild systolic hypertension.  Following routine surgical time-out, procedure commenced with initially a short incision in the line of the ring finger.  Subcutaneous tissues were carefully divided, revealing the palmar fascia.  This was split longitudinally to the common sensory branch of the median  nerve.  There was noted to be an aberrant hypothenar muscle that crossed the thenar muscles.  We carefully teased apart the muscle fibers, looking for an aberrant motor branch to the thenar muscles.  None was identified.  We sequentially released the transverse carpal ligament with scissors along its ulnar border into the forearm level.  Due to a significant degree of adipose tissue in the subcutaneous region, we had to extend the incision proximally to conventional incision to  allow safe visualization of the transverse carpal ligament and volar forearm fascia.  The ligament and fascia released in standard manner.  The median nerve and contents of the carpal canal were inspected.  The ulnar bursa was very fibrotic and hypertrophy.  Bleeding points along the margin of the released transverse carpal ligament were electrocauterized with bipolar current.  The wound was then repaired with a series of segmental intradermal 3-0 Prolene sutures.  A Steri-Strip was applied followed by infiltration of the wound and the region around the median nerve with 2% lidocaine for postoperative comfort.  For aftercare, Ms. Osoria was provided prescriptions for hydrocodone 5 mg 1 or 2 tablets p.o. q.4-6 hours p.r.n. pain, 20 tablets without refill. She was also provided a prescription for tramadol 50 mg 1 p.o. q.4 to 6 hours p.r.n. pain, 20 tablets with 1 refill.     Katy Fitch Milica Gully, M.D.     RVS/MEDQ  D:  05/11/2013  T:  05/12/2013  Job:  [id number]  cc:   Zenovia Jordan, MD

## 2013-09-11 ENCOUNTER — Encounter (INDEPENDENT_AMBULATORY_CARE_PROVIDER_SITE_OTHER): Payer: Self-pay | Admitting: Surgery

## 2013-09-18 ENCOUNTER — Ambulatory Visit (INDEPENDENT_AMBULATORY_CARE_PROVIDER_SITE_OTHER): Payer: BC Managed Care – PPO | Admitting: Surgery

## 2013-09-18 ENCOUNTER — Encounter (INDEPENDENT_AMBULATORY_CARE_PROVIDER_SITE_OTHER): Payer: Self-pay | Admitting: Surgery

## 2013-09-18 VITALS — BP 118/78 | HR 76 | Temp 98.6°F | Resp 16 | Ht 61.5 in | Wt 292.6 lb

## 2013-09-18 DIAGNOSIS — L732 Hidradenitis suppurativa: Secondary | ICD-10-CM | POA: Insufficient documentation

## 2013-09-18 MED ORDER — SULFAMETHOXAZOLE-TMP DS 800-160 MG PO TABS: 1.0000 | ORAL_TABLET | Freq: Two times a day (BID) | ORAL | Status: DC

## 2013-09-18 NOTE — Progress Notes (Signed)
Patient ID: [name], female   DOB: 1961/07/17, 52 y.o.   MRN: [id number]  Chief Complaint  Patient presents with  . New Evaluation    eval hidradenitis under axi    HPI [name] is a 52 y.o. female.  Patient sent at request of Dr. Campbell Stall for right axillary hidradenitis. She has had multiple flareups requiring antibiotics and drainage in May of this year. The area becomes red, swells and drains. She has had other areas like this in the past. Currently, she is on Bactrim for a recent flareup. It has improved on this. HPI  Past Medical History  Diagnosis Date  . Esophageal stricture     hx. of esophageal dilation x 1  . Arthritis     knees, right shoulder  . Muscle pain   . Depression     no current med.  . Hypertension     under control with med., has been on med. x 5 yr.  . Sleep apnea     uses CPAP nightly  . Carpal tunnel syndrome of left wrist 04/2013  . History of difficult intubation   . Difficult intubation   . Dental crowns present   . GERD (gastroesophageal reflux disease)     Past Surgical History  Procedure Laterality Date  . Tonsillectomy and adenoidectomy    . Hysteroscopy w/d&c  01/28/2001  . Laparoscopic supracervical hysterectomy  12/11/2008  . Bilateral salpingoophorectomy  12/11/2008  . Cystoscopy  12/11/2008  . Incision and drainage perirectal abscess  07/03/2011  . Cholecystectomy  1985  . Esophageal dilation    . Carpal tunnel release Left 05/11/2013    Procedure: CARPAL TUNNEL RELEASE;  Surgeon: Wyn Forster., MD;  Location: [facility];  Service: Orthopedics;  Laterality: Left;    Family History  Problem Relation Age of Onset  . COPD Father     Social History History  Substance Use Topics  . Smoking status: Never Smoker   . Smokeless tobacco: Never Used  . Alcohol Use: Yes     Comment: once every 2 months    Allergies  Allergen Reactions  . Aspirin Itching and Swelling  . Demerol [Meperidine] Nausea And Vomiting   . Nsaids Itching and Swelling  . Other Nausea Only    PERFUMES CAUSE HEADACHE  . Salicylates Itching and Swelling  . Dilaudid [Hydromorphone Hcl] Itching  . Iohexol Itching         Current Outpatient Prescriptions  Medication Sig Dispense Refill  . benzonatate (TESSALON) 100 MG capsule       . cyclobenzaprine (FLEXERIL) 10 MG tablet Take 10 mg by mouth 3 (three) times daily as needed for muscle spasms.      Marland Kitchen lisinopril (PRINIVIL,ZESTRIL) 20 MG tablet Take 20 mg by mouth daily.      . Pseudoephedrine-Guaifenesin (MUCINEX D PO) Take by mouth.      . sulfamethoxazole-trimethoprim (BACTRIM DS) 800-160 MG per tablet Take 1 tablet by mouth 2 (two) times daily.  28 tablet  0   No current facility-administered medications for this visit.    Review of Systems Review of Systems  Constitutional: Negative for fever, chills and unexpected weight change.  HENT: Negative for congestion, hearing loss, sore throat, trouble swallowing and voice change.   Eyes: Negative for visual disturbance.  Respiratory: Negative for cough and wheezing.   Cardiovascular: Negative for chest pain, palpitations and leg swelling.  Gastrointestinal: Negative for nausea, vomiting, abdominal pain, diarrhea, constipation, blood  in stool, abdominal distention and anal bleeding.  Genitourinary: Negative for hematuria, vaginal bleeding and difficulty urinating.  Musculoskeletal: Negative for arthralgias.  Skin: Negative for rash and wound.  Neurological: Negative for seizures, syncope and headaches.  Hematological: Negative for adenopathy. Does not bruise/bleed easily.  Psychiatric/Behavioral: Negative for confusion.    Blood pressure 118/78, pulse 76, temperature 98.6 F (37 C), temperature source Temporal, resp. rate 16, height 5' 1.5" (1.562 m), weight 292 lb 9.6 oz (132.722 kg).  Physical Exam Physical Exam  Constitutional: She appears well-developed and well-nourished.  HENT:  Head: Normocephalic and  atraumatic.  Eyes: Pupils are equal, round, and reactive to light. Left eye exhibits no discharge. No scleral icterus.  Neck: Normal range of motion. Neck supple.  Pulmonary/Chest: Effort normal.  Musculoskeletal: Normal range of motion.  Neurological: She is alert.  Skin:     Psychiatric: She has a normal mood and affect. Her behavior is normal. Judgment and thought content normal.    Data Reviewed none  Assessment    3 cm x 3 cm area of hidradenitis right axilla    Plan    Discussed treatment options of conservative treatment versus excision. Risks, benefits and long term expectations of both discussed. The patient like to proceed with excision of hidradenitis right axilla. Discussed open wound versus closure versus possible skin graft. The patient  Understands and she wishes to proceed.The procedure has been discussed with the patient.  Alternative therapies have been discussed with the patient.  Operative risks include bleeding,  Infection,  Organ injury,  Nerve injury,  Blood vessel injury,  DVT,  Pulmonary embolism,  Death,  And possible reoperation.  Medical management risks include worsening of present situation.  The success of the procedure is 50 -90 % at treating patients symptoms.  The patient understands and agrees to proceed.       Yashica Sterbenz A. 09/18/2013, 10:39 AM

## 2013-09-18 NOTE — Patient Instructions (Signed)
Hidradenitis Suppurativa, Sweat Gland Abscess °Hidradenitis suppurativa is a long lasting (chronic), uncommon disease of the sweat glands. With this, boil-like lumps and scarring develop in the groin, some times under the arms (axillae), and under the breasts. It may also uncommonly occur behind the ears, in the crease of the buttocks, and around the genitals.  °CAUSES  °The cause is from a blocking of the sweat glands. They then become infected. It may cause drainage and odor. It is not contagious. So it cannot be given to someone else. It most often shows up in puberty (about 10 to 54 years of age). But it may happen much later. It is similar to acne which is a disease of the sweat glands. This condition is slightly more common in African-Americans and women. °SYMPTOMS  °· Hidradenitis usually starts as one or more red, tender, swellings in the groin or under the arms (axilla). °· Over a period of hours to days the lesions get larger. They often open to the skin surface, draining clear to yellow-colored fluid. °· The infected area heals with scarring. °DIAGNOSIS  °Your caregiver makes this diagnosis by looking at you. Sometimes cultures (growing germs on plates in the lab) may be taken. This is to see what germ (bacterium) is causing the infection.  °TREATMENT  °· Topical germ killing medicine applied to the skin (antibiotics) are the treatment of choice. Antibiotics taken by mouth (systemic) are sometimes needed when the condition is getting worse or is severe. °· Avoid tight-fitting clothing which traps moisture in. °· Dirt does not cause hidradenitis and it is not caused by poor hygiene. °· Involved areas should be cleaned daily using an antibacterial soap. Some patients find that the liquid form of Lever 2000®, applied to the involved areas as a lotion after bathing, can help reduce the odor related to this condition. °· Sometimes surgery is needed to drain infected areas or remove scarred tissue. Removal of  large amounts of tissue is used only in severe cases. °· Birth control pills may be helpful. °· Oral retinoids (vitamin A derivatives) for 6 to 12 months which are effective for acne may also help this condition. °· Weight loss will improve but not cure hidradenitis. It is made worse by being overweight. But the condition is not caused by being overweight. °· This condition is more common in people who have had acne. °· It may become worse under stress. °There is no medical cure for hidradenitis. It can be controlled, but not cured. The condition usually continues for years with periods of getting worse and getting better (remission). °Document Released: 06/30/2004 Document Revised: 02/08/2012 Document Reviewed: 07/16/2008 °ExitCare® Patient Information ©2014 [employer]. ° °

## 2013-10-02 ENCOUNTER — Other Ambulatory Visit (INDEPENDENT_AMBULATORY_CARE_PROVIDER_SITE_OTHER): Payer: Self-pay | Admitting: *Deleted

## 2013-10-02 ENCOUNTER — Telehealth (INDEPENDENT_AMBULATORY_CARE_PROVIDER_SITE_OTHER): Payer: Self-pay | Admitting: *Deleted

## 2013-10-02 MED ORDER — DOXYCYCLINE HYCLATE 50 MG PO CAPS: 100.0000 mg | ORAL_CAPSULE | Freq: Two times a day (BID) | ORAL | Status: DC

## 2013-10-02 NOTE — Telephone Encounter (Signed)
Doxycycline 100 mg po bid  Number 10 stop bactrim

## 2013-10-02 NOTE — Telephone Encounter (Signed)
Prescription sent into pharmacy at this time and patient updated.  Changed number to 20 since Doxycycline only comes in 50mg  tablets so patient will have to take 2 tablets BID.  Patient understandings and agreeable with POC at this time.

## 2013-10-02 NOTE — Telephone Encounter (Signed)
Patient called to report that when she was in the office on 09/18/13 Dr. Luisa Hart prescribed her Bactrim.  Patient reports a sore throat soon after beginning the Bactrim.  Patient then states she developed a URI and had to be placed on Amoxicillin during which time she stopped the Bactrim.  Patient reports the sore throat went away and she was feeling better from the URI.  Patient reports she started back on the Bactrim on Saturday since she had finished the Amoxicillin however the sore throat has now come back.  Patient is concerned due to have severe reactions to some antibiotics in the past.  Patient is asking if she can be put on another antibiotic.  Patient asked specifically about Doxycycline since she has been on that previously without difficulty.  Explained that I would send a message to Dr. Luisa Hart to ask what he feels the best plan would be then we will update her.  Patient states understanding and agreeable at this time.

## 2013-10-19 ENCOUNTER — Other Ambulatory Visit (INDEPENDENT_AMBULATORY_CARE_PROVIDER_SITE_OTHER): Payer: Self-pay | Admitting: Surgery

## 2013-10-19 DIAGNOSIS — L732 Hidradenitis suppurativa: Secondary | ICD-10-CM

## 2013-10-20 ENCOUNTER — Other Ambulatory Visit (INDEPENDENT_AMBULATORY_CARE_PROVIDER_SITE_OTHER): Payer: Self-pay | Admitting: *Deleted

## 2013-10-20 MED ORDER — OXYCODONE-ACETAMINOPHEN 5-325 MG PO TABS: 1.0000 | ORAL_TABLET | ORAL | Status: DC | PRN

## 2013-11-13 ENCOUNTER — Encounter (INDEPENDENT_AMBULATORY_CARE_PROVIDER_SITE_OTHER): Payer: Self-pay | Admitting: Surgery

## 2013-11-13 ENCOUNTER — Ambulatory Visit (INDEPENDENT_AMBULATORY_CARE_PROVIDER_SITE_OTHER): Payer: BC Managed Care – PPO | Admitting: Surgery

## 2013-11-13 VITALS — BP 118/82 | HR 68 | Temp 98.4°F | Resp 14 | Ht 61.5 in | Wt 289.6 lb

## 2013-11-13 DIAGNOSIS — Z9889 Other specified postprocedural states: Secondary | ICD-10-CM

## 2013-11-13 NOTE — Patient Instructions (Signed)
SHAVE AND APPLY DEODORANT CAREFULLY. RETURN AS NEEDED.

## 2013-11-13 NOTE — Progress Notes (Signed)
Patient returns after excision of right axillary hidradenitis one month ago. She is doing well.  Exam: Right nasal shows well healing incision. No signs of infection or drainage. No separation.  Impression: Status post excision right axillary hidradenitis  Plan: Resume full activity. Return as needed.

## 2014-04-11 ENCOUNTER — Other Ambulatory Visit: Payer: Self-pay | Admitting: Family Medicine

## 2014-04-11 ENCOUNTER — Ambulatory Visit
Admission: RE | Admit: 2014-04-11 | Discharge: 2014-04-11 | Disposition: A | Discharge status: Home / Self Care | Payer: BC Managed Care – PPO | Source: Ambulatory Visit | Attending: Family Medicine | Admitting: Family Medicine

## 2014-04-11 DIAGNOSIS — R319 Hematuria, unspecified: Secondary | ICD-10-CM

## 2014-04-11 DIAGNOSIS — R3989 Other symptoms and signs involving the genitourinary system: Secondary | ICD-10-CM

## 2014-05-21 ENCOUNTER — Ambulatory Visit (HOSPITAL_BASED_OUTPATIENT_CLINIC_OR_DEPARTMENT_OTHER): Payer: BC Managed Care – PPO | Admitting: Anesthesiology

## 2014-05-21 ENCOUNTER — Ambulatory Visit (HOSPITAL_BASED_OUTPATIENT_CLINIC_OR_DEPARTMENT_OTHER)
Admission: RE | Admit: 2014-05-21 | Discharge: 2014-05-21 | Disposition: A | Discharge status: Home / Self Care | Payer: BC Managed Care – PPO | Source: Ambulatory Visit | Attending: Podiatry | Admitting: Podiatry

## 2014-05-21 ENCOUNTER — Encounter (HOSPITAL_BASED_OUTPATIENT_CLINIC_OR_DEPARTMENT_OTHER): Payer: Self-pay | Admitting: *Deleted

## 2014-05-21 ENCOUNTER — Encounter (HOSPITAL_BASED_OUTPATIENT_CLINIC_OR_DEPARTMENT_OTHER)
Admission: RE | Disposition: A | Discharge status: Home / Self Care | Payer: Self-pay | Source: Ambulatory Visit | Attending: Podiatry

## 2014-05-21 ENCOUNTER — Encounter (HOSPITAL_BASED_OUTPATIENT_CLINIC_OR_DEPARTMENT_OTHER): Payer: BC Managed Care – PPO | Admitting: Anesthesiology

## 2014-05-21 DIAGNOSIS — F329 Major depressive disorder, single episode, unspecified: Secondary | ICD-10-CM | POA: Insufficient documentation

## 2014-05-21 DIAGNOSIS — Z79899 Other long term (current) drug therapy: Secondary | ICD-10-CM | POA: Insufficient documentation

## 2014-05-21 DIAGNOSIS — I10 Essential (primary) hypertension: Secondary | ICD-10-CM | POA: Insufficient documentation

## 2014-05-21 DIAGNOSIS — F3289 Other specified depressive episodes: Secondary | ICD-10-CM | POA: Insufficient documentation

## 2014-05-21 DIAGNOSIS — G4730 Sleep apnea, unspecified: Secondary | ICD-10-CM | POA: Insufficient documentation

## 2014-05-21 DIAGNOSIS — M898X9 Other specified disorders of bone, unspecified site: Secondary | ICD-10-CM | POA: Insufficient documentation

## 2014-05-21 DIAGNOSIS — Z888 Allergy status to other drugs, medicaments and biological substances status: Secondary | ICD-10-CM | POA: Insufficient documentation

## 2014-05-21 DIAGNOSIS — IMO0001 Reserved for inherently not codable concepts without codable children: Secondary | ICD-10-CM | POA: Insufficient documentation

## 2014-05-21 DIAGNOSIS — K219 Gastro-esophageal reflux disease without esophagitis: Secondary | ICD-10-CM | POA: Insufficient documentation

## 2014-05-21 DIAGNOSIS — G5790 Unspecified mononeuropathy of unspecified lower limb: Secondary | ICD-10-CM | POA: Insufficient documentation

## 2014-05-21 HISTORY — PX: EXCISION MORTON'S NEUROMA: SHX5013

## 2014-05-21 HISTORY — PX: HEEL SPUR RESECTION: SHX6410

## 2014-05-21 LAB — POCT HEMOGLOBIN-HEMACUE: HEMOGLOBIN: 11.8 g/dL — AB (ref 12.0–15.0)

## 2014-05-21 LAB — POCT I-STAT, CHEM 8
BUN: 18 mg/dL (ref 6–23)
CHLORIDE: 100 meq/L (ref 96–112)
Calcium, Ion: 1.22 mmol/L (ref 1.12–1.23)
Creatinine, Ser: 0.6 mg/dL (ref 0.50–1.10)
Glucose, Bld: 87 mg/dL (ref 70–99)
HEMATOCRIT: 63 % — AB (ref 36.0–46.0)
HEMOGLOBIN: 21.4 g/dL — AB (ref 12.0–15.0)
POTASSIUM: 3.9 meq/L (ref 3.7–5.3)
SODIUM: 144 meq/L (ref 137–147)
TCO2: 29 mmol/L (ref 0–100)

## 2014-05-21 SURGERY — EXCISION, BONE SPUR, CALCANEUS
Anesthesia: Monitor Anesthesia Care | Site: Foot | Laterality: Right

## 2014-05-21 MED ORDER — PROPOFOL 10 MG/ML IV EMUL: INTRAVENOUS | Status: DC | PRN

## 2014-05-21 MED ORDER — LACTATED RINGERS IV SOLN
INTRAVENOUS | Status: DC
  Administered 2014-05-21 (×2): via INTRAVENOUS
  Filled 2014-05-21: qty 1000

## 2014-05-21 MED ORDER — ACETAMINOPHEN 325 MG PO TABS
650.0000 mg | ORAL_TABLET | ORAL | Status: DC | PRN
  Filled 2014-05-21: qty 2

## 2014-05-21 MED ORDER — FENTANYL CITRATE 0.05 MG/ML IJ SOLN
INTRAMUSCULAR | Status: DC | PRN
  Administered 2014-05-21: 50 ug via INTRAVENOUS

## 2014-05-21 MED ORDER — ONDANSETRON HCL 4 MG/2ML IJ SOLN
INTRAMUSCULAR | Status: DC | PRN
  Administered 2014-05-21: 4 mg via INTRAVENOUS

## 2014-05-21 MED ORDER — FENTANYL CITRATE 0.05 MG/ML IJ SOLN
25.0000 ug | INTRAMUSCULAR | Status: DC | PRN
  Filled 2014-05-21: qty 1

## 2014-05-21 MED ORDER — MIDAZOLAM HCL 5 MG/5ML IJ SOLN
INTRAMUSCULAR | Status: DC | PRN
  Administered 2014-05-21 (×2): 1 mg via INTRAVENOUS

## 2014-05-21 MED ORDER — PROMETHAZINE HCL 25 MG/ML IJ SOLN
6.2500 mg | INTRAMUSCULAR | Status: DC | PRN
  Filled 2014-05-21: qty 1

## 2014-05-21 MED ORDER — LIDOCAINE HCL (CARDIAC) 20 MG/ML IV SOLN
INTRAVENOUS | Status: DC | PRN
  Administered 2014-05-21: 60 mg via INTRAVENOUS

## 2014-05-21 MED ORDER — PROMETHAZINE HCL 12.5 MG PO TABS
12.5000 mg | ORAL_TABLET | ORAL | Status: DC | PRN
  Filled 2014-05-21: qty 1

## 2014-05-21 MED ORDER — HYDROCODONE-ACETAMINOPHEN 5-325 MG PO TABS
1.0000 | ORAL_TABLET | ORAL | Status: DC | PRN
Start: 2014-05-21 — End: 2014-05-21
  Filled 2014-05-21: qty 1

## 2014-05-21 MED ORDER — LIDOCAINE HCL 2 % IJ SOLN
INTRAMUSCULAR | Status: DC | PRN
  Administered 2014-05-21: 10 mL

## 2014-05-21 MED ORDER — PROPOFOL 10 MG/ML IV BOLUS
INTRAVENOUS | Status: DC | PRN
  Administered 2014-05-21: 100 mg via INTRAVENOUS

## 2014-05-21 MED ORDER — PROPOFOL 10 MG/ML IV EMUL
INTRAVENOUS | Status: DC | PRN
  Administered 2014-05-21: 140 ug/kg/min via INTRAVENOUS

## 2014-05-21 MED ORDER — MIDAZOLAM HCL 2 MG/2ML IJ SOLN
INTRAMUSCULAR | Status: AC
  Filled 2014-05-21: qty 2

## 2014-05-21 MED ORDER — SODIUM CHLORIDE 0.9 % IR SOLN
Status: DC | PRN
  Administered 2014-05-21: 500 mL

## 2014-05-21 MED ORDER — DEXTROSE 5 % IV SOLN
2.0000 g | INTRAVENOUS | Status: DC
  Filled 2014-05-21: qty 2

## 2014-05-21 MED ORDER — LACTATED RINGERS IV SOLN
INTRAVENOUS | Status: DC
  Filled 2014-05-21: qty 1000

## 2014-05-21 MED ORDER — CEFAZOLIN SODIUM 10 G IJ SOLR
3.0000 g | INTRAMUSCULAR | Status: DC | PRN
  Administered 2014-05-21: 3 g via INTRAVENOUS

## 2014-05-21 MED ORDER — DEXAMETHASONE SODIUM PHOSPHATE 4 MG/ML IJ SOLN
INTRAMUSCULAR | Status: DC | PRN
  Administered 2014-05-21: 10 mg via INTRAVENOUS

## 2014-05-21 MED ORDER — FENTANYL CITRATE 0.05 MG/ML IJ SOLN
INTRAMUSCULAR | Status: AC
  Filled 2014-05-21: qty 6

## 2014-05-21 MED ORDER — BUPIVACAINE HCL 0.5 % IJ SOLN
INTRAMUSCULAR | Status: DC | PRN
  Administered 2014-05-21: 10 mL

## 2014-05-21 MED ORDER — SODIUM CHLORIDE 0.9 % IJ SOLN
3.0000 mL | INTRAMUSCULAR | Status: DC | PRN
  Filled 2014-05-21: qty 3

## 2014-05-21 SURGICAL SUPPLY — 58 items
BANDAGE CO FLEX L/F 2IN X 5YD (GAUZE/BANDAGES/DRESSINGS) ×4 IMPLANT
BANDAGE ELASTIC 4 VELCRO ST LF (GAUZE/BANDAGES/DRESSINGS) ×4 IMPLANT
BANDAGE ELASTIC 6 VELCRO ST LF (GAUZE/BANDAGES/DRESSINGS) IMPLANT
BANDAGE GAUZE ELAST BULKY 4 IN (GAUZE/BANDAGES/DRESSINGS) ×4 IMPLANT
BENZOIN TINCTURE PRP APPL 2/3 (GAUZE/BANDAGES/DRESSINGS) IMPLANT
BLADE OSC/SAG .038X5.5 CUT EDG (BLADE) IMPLANT
BLADE SAW SAGI 13.0X70X1.19 (BLADE) IMPLANT
BLADE SAW SAGI 13.0X70X1.19MM (BLADE)
BLADE SURG 15 STRL LF DISP TIS (BLADE) ×4 IMPLANT
BLADE SURG 15 STRL SS (BLADE) ×4
BNDG CONFORM 2 STRL LF (GAUZE/BANDAGES/DRESSINGS) ×4 IMPLANT
BNDG ESMARK 4X9 LF (GAUZE/BANDAGES/DRESSINGS) ×4 IMPLANT
BUR SIDE CUT 44.8 STRL (BURR) IMPLANT
BURR SIDE CUT 44.8 STRL (BURR)
BURR SIDE CUT 44.8MML STRL (BURR)
COVER MAYO STAND STRL (DRAPES) ×4 IMPLANT
COVER TABLE BACK 60X90 (DRAPES) ×4 IMPLANT
DRAPE EXTREMITY T 121X128X90 (DRAPE) ×4 IMPLANT
DRAPE OEC MINIVIEW 54X84 (DRAPES) IMPLANT
DRSG EMULSION OIL 3X3 NADH (GAUZE/BANDAGES/DRESSINGS) IMPLANT
DURAPREP 26ML APPLICATOR (WOUND CARE) ×4 IMPLANT
ELECT REM PT RETURN 9FT ADLT (ELECTROSURGICAL) ×4
ELECTRODE REM PT RTRN 9FT ADLT (ELECTROSURGICAL) ×2 IMPLANT
GAUZE SPONGE 4X4 12PLY STRL LF (GAUZE/BANDAGES/DRESSINGS) IMPLANT
GAUZE SPONGE 4X4 16PLY XRAY LF (GAUZE/BANDAGES/DRESSINGS) ×4 IMPLANT
GAUZE XEROFORM 1X8 LF (GAUZE/BANDAGES/DRESSINGS) ×4 IMPLANT
GLOVE BIO SURGEON STRL SZ7.5 (GLOVE) ×8 IMPLANT
GLOVE BIOGEL PI IND STRL 7.5 (GLOVE) ×2 IMPLANT
GLOVE BIOGEL PI INDICATOR 7.5 (GLOVE) ×2
GOWN PREVENTION PLUS XLARGE (GOWN DISPOSABLE) IMPLANT
GOWN STRL REUS W/ TWL LRG LVL3 (GOWN DISPOSABLE) ×2 IMPLANT
GOWN STRL REUS W/ TWL XL LVL3 (GOWN DISPOSABLE) ×2 IMPLANT
GOWN STRL REUS W/TWL LRG LVL3 (GOWN DISPOSABLE) ×2
GOWN STRL REUS W/TWL XL LVL3 (GOWN DISPOSABLE) ×2
KWIRE 4.0 X .045IN (WIRE) IMPLANT
NEEDLE HYPO 22GX1.5 SAFETY (NEEDLE) ×4 IMPLANT
PACK BASIN DAY SURGERY FS (CUSTOM PROCEDURE TRAY) ×4 IMPLANT
PADDING CAST ABS 4INX4YD NS (CAST SUPPLIES) ×2
PADDING CAST ABS COTTON 4X4 ST (CAST SUPPLIES) ×2 IMPLANT
PADDING CAST SYNTHETIC 2 (CAST SUPPLIES)
PADDING CAST SYNTHETIC 2X4 NS (CAST SUPPLIES) IMPLANT
PADDING CAST SYNTHETIC 3 NS LF (CAST SUPPLIES)
PADDING CAST SYNTHETIC 3X4 NS (CAST SUPPLIES) IMPLANT
PENCIL BUTTON HOLSTER BLD 10FT (ELECTRODE) ×4 IMPLANT
RASP SM TEAR CROSS CUT (RASP) ×4 IMPLANT
SPONGE GAUZE 4X4 12PLY (GAUZE/BANDAGES/DRESSINGS) ×4 IMPLANT
SPONGE GAUZE 4X4 12PLY STER LF (GAUZE/BANDAGES/DRESSINGS) ×4 IMPLANT
SPONGE LAP 4X18 X RAY DECT (DISPOSABLE) ×4 IMPLANT
STOCKINETTE 6  STRL (DRAPES) ×2
STOCKINETTE 6 STRL (DRAPES) ×2 IMPLANT
STOCKINETTE SYNTHETIC 4 NONSTR (MISCELLANEOUS) ×4 IMPLANT
SUT ETHILON 4 0 PS 2 18 (SUTURE) ×4 IMPLANT
SUT ETHILON 5 0 PS 2 18 (SUTURE) IMPLANT
SUT VIC AB 3-0 FS2 27 (SUTURE) ×4 IMPLANT
SUT VICRYL 4-0 PS2 18IN ABS (SUTURE) ×4 IMPLANT
SYR BULB 3OZ (MISCELLANEOUS) ×4 IMPLANT
SYR CONTROL 10ML LL (SYRINGE) ×8 IMPLANT
UNDERPAD 30X30 INCONTINENT (UNDERPADS AND DIAPERS) ×4 IMPLANT

## 2014-05-21 NOTE — Discharge Instructions (Signed)
Post Anesthesia Home Care Instructions  Activity: Get plenty of rest for the remainder of the day. A responsible adult should stay with you for 24 hours following the procedure.  For the next 24 hours, DO NOT: -Drive a car -Advertising copywriterperate machinery -Drink alcoholic beverages -Take any medication unless instructed by your physician -Make any legal decisions or sign important papers.  Meals: Start with liquid foods such as gelatin or soup. Progress to regular foods as tolerated. Avoid greasy, spicy, heavy foods. If nausea and/or vomiting occur, drink only clear liquids until the nausea and/or vomiting subsides. Call your physician if vomiting continues.  Special Instructions/Symptoms: Your throat may feel dry or sore from the anesthesia or the breathing tube placed in your throat during surgery. If this causes discomfort, gargle with warm salt water. The discomfort should disappear within 24 hours. Outpatient Surgery Guidelines Outpatient procedures are those for which the person having the procedure is allowed to go home the same day as the procedure. Various procedures are done on an outpatient basis. You should follow some general guidelines if you will be having an outpatient procedure. LET [facility]OUR HEALTH CARE PROVIDER KNOW ABOUT:  Any allergies you have.  All medicines you are taking, including vitamins, herbs, eye drops, creams, and over-the-counter medicines.  Previous problems you or members of your family have had with the use of anesthetics.  Any blood disorders you have.  Previous surgeries you have had.  Medical conditions you have. RISKS AND COMPLICATIONS Your health care provider will discuss possible risks and complications with you before surgery. Common risks and complications include:   Problems due to the use of anesthetics.  Blood loss and replacement (does not apply to minor surgical procedures).  Temporary increase in pain due to surgery.  Uncorrected pain or  problems that the surgery was meant to correct.  Infection.  New damage. BEFORE THE PROCEDURE  Ask your health care provider about changing or stopping your regular medicines. You may need to stop taking certain medicines in the days or weeks before the procedure.  Stop smoking at least 2 weeks before surgery. This lowers your risk for complications during and after surgery. Ask your health care provider for help with this if needed.  Eat your usual meals and a light supper the day before surgery. Continue fluid intake. Do not drink alcohol.  Do not eat or drink after midnight the night before your surgery. Take your usual medicine the morning of surgery with a sip of water unless instructed otherwise. Check with your health care provider if you are unsure. This is particularly important if you take diabetes medicine.  Arrange for someone to take you home and to stay with you for 24 hours after the procedure. Medicine given for your procedure may affect your ability to drive or to care for yourself.  Call your health care provider's office if you develop an illness or problem that may prevent you from safely having your procedure. AFTER THE PROCEDURE After surgery, you will be taken to a recovery area, where your progress will be monitored. If there are no complications, you will be allowed to go home when you are awake, stable, and taking fluids well. You may have numbness around the surgical site. Healing will take some time. You will have tenderness at the surgical site and may have some swelling and bruising. You may also have some nausea. HOME CARE INSTRUCTIONS  Do not drive for 24 hours, or as directed by your health care provider.  Do not drive while taking prescription pain medicines.  Do not drink alcohol for 24 hours.  Do not make important decisions or sign legal documents for 24 hours.  You may resume a normal diet and activities as directed.  Do not lift anything heavier  than 10 pounds (4.5 kg) or play contact sports until your health care provider says it is okay.  Change your bandages (dressings) as directed.  Only take over-the-counter or prescription medicines as directed by your health care provider.  Follow up with your health care provider as directed. SEEK MEDICAL CARE IF:  You have increased bleeding (more than a small spot) from the surgical site.  You have redness, swelling, or increasing pain in the wound.  You see pus coming from the wound.  You have a fever.  You notice a bad smell coming from the wound or dressing.  You feel lightheaded or faint.  You develop a rash.  You have trouble breathing.  You develop allergies. MAKE SURE YOU:  Understand these instructions.  Will watch your condition.  Will get help right away if you are not doing well or get worse. Document Released: 08/11/2001 Document Revised: 11/21/2013 Document Reviewed: 04/20/2013 [facility]ospExitCare Patient Information 2015 [place], [place]. This information is not intended to replace advice given to you by your health care provider. Make sure you discuss any questions you have with your health care provider.

## 2014-05-21 NOTE — Transfer of Care (Signed)
Immediate Anesthesia Transfer of Care Note  Patient: Amanda HoyleRita S Tineo  Procedure(s) Performed: Procedure(s) (LRB): HEEL SPUR RESECTION (Right) EXCISION MORTON'S NEUROMA (Right)  Patient Location: PACU  Anesthesia Type: General  Level of Consciousness: awake, oriented, sedated and patient cooperative  Airway & Oxygen Therapy: Patient Spontanous Breathing and Patient connected to face mask oxygen  Post-op Assessment: Report given to PACU RN and Post -op Vital signs reviewed and stable  Post vital signs: Reviewed and stable  Complications: No apparent anesthesia complications

## 2014-05-21 NOTE — Anesthesia Procedure Notes (Signed)
Procedure Name: LMA Insertion Date/Time: 05/21/2014 4:38 PM Performed by: Renella CunasHAZEL, LISA D Pre-anesthesia Checklist: Patient identified, Emergency Drugs available, Suction available and Patient being monitored Patient Re-evaluated:Patient Re-evaluated prior to inductionOxygen Delivery Method: Circle System Utilized Preoxygenation: Pre-oxygenation with 100% oxygen Intubation Type: IV induction Ventilation: Mask ventilation without difficulty LMA: LMA inserted LMA Size: 4.0 Number of attempts: 1 Airway Equipment and Method: bite block and Patient positioned with wedge pillow Placement Confirmation: positive ETCO2 Tube secured with: Tape Dental Injury: Teeth and Oropharynx as per pre-operative assessment

## 2014-05-21 NOTE — Anesthesia Postprocedure Evaluation (Signed)
Anesthesia Post Note  Patient: [name]  Procedure(s) Performed: Procedure(s) (LRB): HEEL SPUR RESECTION (Right) EXCISION MORTON'S NEUROMA (Right)  Anesthesia type: General  Patient location: PACU  Post pain: Pain level controlled  Post assessment: Post-op Vital signs reviewed  Last Vitals: BP 119/75  Pulse 75  Temp(Src) 36.4 C (Oral)  Resp 16  Wt 289 lb (131.09 kg)  SpO2 95%  Post vital signs: Reviewed  Level of consciousness: sedated  Complications: No apparent anesthesia complications

## 2014-05-21 NOTE — Anesthesia Preprocedure Evaluation (Addendum)
Anesthesia Evaluation  Patient identified by MRN, date of birth, ID band Patient awake    Reviewed: Allergy & Precautions, H&P , NPO status , Patient's Chart, lab work & pertinent test results  History of Anesthesia Complications (+) DIFFICULT AIRWAY and history of anesthetic complications  Airway Mallampati: IV TM Distance: <3 FB Neck ROM: Limited    Dental  (+) Teeth Intact, Dental Advisory Given   Pulmonary neg pulmonary ROS, sleep apnea and Continuous Positive Airway Pressure Ventilation ,  breath sounds clear to auscultation  Pulmonary exam normal       Cardiovascular hypertension, Pt. on medications Rhythm:Regular Rate:Normal     Neuro/Psych Depression  Neuromuscular disease negative neurological ROS  negative psych ROS   GI/Hepatic Neg liver ROS, GERD-  ,  Endo/Other  Morbid obesity  Renal/GU negative Renal ROS  negative genitourinary   Musculoskeletal negative musculoskeletal ROS (+)   Abdominal   Peds  Hematology negative hematology ROS (+)   Anesthesia Other Findings Prior history of difficult airway with prior general anesthetic.  Reproductive/Obstetrics                          Anesthesia Physical Anesthesia Plan  ASA: III  Anesthesia Plan: MAC   Post-op Pain Management:    Induction: Intravenous  Airway Management Planned: Simple Face Mask  Additional Equipment:   Intra-op Plan:   Post-operative Plan:   Informed Consent: I have reviewed the patients History and Physical, chart, labs and discussed the procedure including the risks, benefits and alternatives for the proposed anesthesia with the patient or authorized representative who has indicated his/her understanding and acceptance.   Dental advisory given  Plan Discussed with: CRNA  Anesthesia Plan Comments:         Anesthesia Quick Evaluation

## 2014-05-22 ENCOUNTER — Encounter (HOSPITAL_BASED_OUTPATIENT_CLINIC_OR_DEPARTMENT_OTHER): Payer: Self-pay | Admitting: Podiatry

## 2014-05-24 NOTE — Op Note (Signed)
NAMEduard Clos:  [name], [name]                   ACCOUNT NO.:  [id number]  MEDICAL RECORD NO.:  [id number]  LOCATION:                                 FACILITY:  PHYSICIAN:  Ezequiel KayserMartha J. Wesleigh Markovic, D.P.M.DATE OF BIRTH:  [birth year]  DATE OF PROCEDURE:  05/21/2014 DATE OF DISCHARGE:                              OPERATIVE REPORT   SURGEON:  Ezequiel KayserMartha J. Brittiny Levitz, D.P.M.  ASSISTANT:  None.  PREOPERATIVE DIAGNOSES: 1. Mid foot exostosis, right foot. 2. Neuritis, mid foot right foot with dorsal cutaneous nerve     entrapment.  POSTOPERATIVE DIAGNOSES: 1. Mid foot exostosis, right foot. 2. Neuritis, mid foot right foot with dorsal cutaneous nerve     entrapment.  PROCEDURE: 1. Mid foot radical bone resection, right foot. 2. Release nerve entrapment, dorsal cutaneous nerve, right foot.  ANESTHESIA:  LMA.  COMPLICATIONS:  None.  DESCRIPTION OF PROCEDURE:  The patient was brought to the OR and placed in the supine position at which time monitored anesthesia care was administered.  A local block was performed with a 1:1 mixture of 0.5% Marcaine plain and 2% lidocaine plain.  A well-padded pneumatic ankle tourniquet was inflated to 250 mmHg.  The patient was prepped and draped in the usual aseptic manner.  Before the procedure was actually started, the patient was restless and therefore, the decision was made to make the case an LMA.  Next, overlying the prominence of bone, a curvy S-shaped incision was made over the prominence.  The incision was deepened via sharp/blunt modalities, taking great care to protect the neurovascular bundle throughout the entire case.  Dissection was carried via blunt dissection until level of bone as the soft tissue and neurovascular bundle were protected and retracted.  Once exposed deep, incision was made overlying the prominence of bone and once exposed, this was resected using an osteotome and mallet, and a power rasp was used to reduce all prominences of bone.  This  was found to be smooth and without impingement.  Surgical wound was irrigated with copious amounts of sterile saline and antibiotic solution.  Attention was directed to the neurovascular bundle where the soft tissue was freed carefully using blunt dissection with a fine small curved hemostat, thus releasing the entrapment of the dorsal cutaneous nerve. Again, the wound was irrigated and deep closure was accomplished with 4- 0 Vicryl and skin closure with 4-0 Monocryl.  The foot was dressed with Xeroform, fluff, Kerlix, and an Ace bandage.  The tourniquet was deflated and vascular status returned to all digits.  The patient was sent to the recovery room with stable signs stable and capillary refill time at presurgical levels.  Both written and oral postop instructions were given to the patient.  No guarantees given.          ______________________________ Ezequiel KayserMartha J. Harriet PhoAjlouny, D.P.M.     MJA/MEDQ  D:  05/22/2014  T:  05/23/2014  Job:  [id number]

## 2014-08-15 ENCOUNTER — Encounter (HOSPITAL_BASED_OUTPATIENT_CLINIC_OR_DEPARTMENT_OTHER): Payer: BC Managed Care – PPO | Attending: General Surgery

## 2014-08-15 DIAGNOSIS — Y849 Medical procedure, unspecified as the cause of abnormal reaction of the patient, or of later complication, without mention of misadventure at the time of the procedure: Secondary | ICD-10-CM | POA: Diagnosis not present

## 2014-08-15 DIAGNOSIS — T8189XA Other complications of procedures, not elsewhere classified, initial encounter: Secondary | ICD-10-CM | POA: Diagnosis present

## 2014-08-15 NOTE — Progress Notes (Signed)
[facility]  NAMEGLADYSE, [name]                   ACCOUNT NO.:  [id number]  MEDICAL RECORD NO.:  [id number]      DATE OF BIRTH:  1961/05/11  PHYSICIAN:  Ardath Sax, M.D.      VISIT DATE:  08/15/2014                                  OFFICE VISIT   [name] is a 53 year old morbidly obese female who apparently had an area of probably an osteophyte removed by podiatrist from the dorsal aspect of her right foot.  This was done months ago and has never healed.  It has even been secondarily closed and continues to break open.  She has actually a small 2 cm linear wound on the dorsal aspect of her right foot that undermines a little bit and had some devascularized fat that I gently teased away with a curette.  This would be classified as a nonhealing surgical wound and  after I debrided it, I scrubbed it; and I am planning on treating it with silver collagen which I placed in the wound and we showed her how to change the dressing daily.  I told her to elevate this as much as she could.  This lady as I mentioned is morbidly obese.  She weighs 300 pounds and is 5 foot 1 inch.  Her temperature today was 98, pulse 71, respirations 14, blood pressure 134/98.  She is not a diabetic.  She is on lisinopril for hypertension.  She is also on Flexeril because of her joint pain, and she takes Cymbalta.  We put the collagen on and wrapped her in a dressing, and she will come back in a week.  DIAGNOSES: 1. So, her diagnosis is nonhealing surgical wound, dorsal aspect of     the right foot. 2. Hypertension. 3. Morbid obesity.     Ardath Sax, M.D.     PP/MEDQ  D:  08/15/2014  T:  08/15/2014  Job:  [id number]

## 2014-08-22 DIAGNOSIS — T8189XA Other complications of procedures, not elsewhere classified, initial encounter: Secondary | ICD-10-CM | POA: Diagnosis not present

## 2014-08-29 DIAGNOSIS — T8189XA Other complications of procedures, not elsewhere classified, initial encounter: Secondary | ICD-10-CM | POA: Diagnosis not present

## 2014-09-05 ENCOUNTER — Encounter (HOSPITAL_BASED_OUTPATIENT_CLINIC_OR_DEPARTMENT_OTHER): Payer: BC Managed Care – PPO | Attending: General Surgery

## 2014-09-05 DIAGNOSIS — T8189XD Other complications of procedures, not elsewhere classified, subsequent encounter: Secondary | ICD-10-CM | POA: Diagnosis not present

## 2014-09-07 ENCOUNTER — Ambulatory Visit
Admission: RE | Admit: 2014-09-07 | Discharge: 2014-09-07 | Disposition: A | Discharge status: Home / Self Care | Payer: BC Managed Care – PPO | Source: Ambulatory Visit | Attending: Family Medicine | Admitting: Family Medicine

## 2014-09-07 ENCOUNTER — Other Ambulatory Visit: Payer: Self-pay | Admitting: Family Medicine

## 2014-09-07 DIAGNOSIS — M79675 Pain in left toe(s): Secondary | ICD-10-CM

## 2014-09-12 DIAGNOSIS — T8189XD Other complications of procedures, not elsewhere classified, subsequent encounter: Secondary | ICD-10-CM | POA: Diagnosis not present

## 2014-09-25 DIAGNOSIS — T8189XD Other complications of procedures, not elsewhere classified, subsequent encounter: Secondary | ICD-10-CM | POA: Diagnosis not present

## 2014-10-02 ENCOUNTER — Encounter (HOSPITAL_BASED_OUTPATIENT_CLINIC_OR_DEPARTMENT_OTHER): Payer: BC Managed Care – PPO | Attending: General Surgery

## 2014-10-02 DIAGNOSIS — T8189XD Other complications of procedures, not elsewhere classified, subsequent encounter: Secondary | ICD-10-CM | POA: Diagnosis present

## 2014-10-02 DIAGNOSIS — S91301D Unspecified open wound, right foot, subsequent encounter: Secondary | ICD-10-CM | POA: Insufficient documentation

## 2014-10-02 DIAGNOSIS — Y839 Surgical procedure, unspecified as the cause of abnormal reaction of the patient, or of later complication, without mention of misadventure at the time of the procedure: Secondary | ICD-10-CM | POA: Diagnosis not present

## 2014-10-09 DIAGNOSIS — S91301D Unspecified open wound, right foot, subsequent encounter: Secondary | ICD-10-CM | POA: Diagnosis not present

## 2014-10-09 DIAGNOSIS — T8189XD Other complications of procedures, not elsewhere classified, subsequent encounter: Secondary | ICD-10-CM | POA: Diagnosis not present

## 2014-10-16 DIAGNOSIS — S91301D Unspecified open wound, right foot, subsequent encounter: Secondary | ICD-10-CM | POA: Diagnosis not present

## 2014-10-16 DIAGNOSIS — T8189XD Other complications of procedures, not elsewhere classified, subsequent encounter: Secondary | ICD-10-CM | POA: Diagnosis not present

## 2014-10-23 DIAGNOSIS — S91301D Unspecified open wound, right foot, subsequent encounter: Secondary | ICD-10-CM | POA: Diagnosis not present

## 2014-10-23 DIAGNOSIS — T8189XD Other complications of procedures, not elsewhere classified, subsequent encounter: Secondary | ICD-10-CM | POA: Diagnosis not present

## 2014-10-30 ENCOUNTER — Encounter (HOSPITAL_BASED_OUTPATIENT_CLINIC_OR_DEPARTMENT_OTHER): Payer: Self-pay | Admitting: Podiatry

## 2014-12-10 ENCOUNTER — Encounter: Payer: Self-pay | Admitting: Gastroenterology

## 2015-05-29 ENCOUNTER — Emergency Department (HOSPITAL_COMMUNITY): Payer: BLUE CROSS/BLUE SHIELD

## 2015-05-29 ENCOUNTER — Encounter (HOSPITAL_COMMUNITY): Payer: Self-pay | Admitting: Family Medicine

## 2015-05-29 ENCOUNTER — Emergency Department (HOSPITAL_COMMUNITY)
Admission: EM | Admit: 2015-05-29 | Discharge: 2015-05-29 | Disposition: A | Discharge status: Home / Self Care | Payer: BLUE CROSS/BLUE SHIELD | Attending: Emergency Medicine | Admitting: Emergency Medicine

## 2015-05-29 DIAGNOSIS — M159 Polyosteoarthritis, unspecified: Secondary | ICD-10-CM | POA: Insufficient documentation

## 2015-05-29 DIAGNOSIS — F329 Major depressive disorder, single episode, unspecified: Secondary | ICD-10-CM | POA: Insufficient documentation

## 2015-05-29 DIAGNOSIS — R0789 Other chest pain: Secondary | ICD-10-CM | POA: Diagnosis not present

## 2015-05-29 DIAGNOSIS — R0781 Pleurodynia: Secondary | ICD-10-CM

## 2015-05-29 DIAGNOSIS — I10 Essential (primary) hypertension: Secondary | ICD-10-CM | POA: Insufficient documentation

## 2015-05-29 DIAGNOSIS — Z79899 Other long term (current) drug therapy: Secondary | ICD-10-CM | POA: Diagnosis not present

## 2015-05-29 DIAGNOSIS — Z8719 Personal history of other diseases of the digestive system: Secondary | ICD-10-CM | POA: Diagnosis not present

## 2015-05-29 DIAGNOSIS — G4730 Sleep apnea, unspecified: Secondary | ICD-10-CM | POA: Diagnosis not present

## 2015-05-29 DIAGNOSIS — R079 Chest pain, unspecified: Secondary | ICD-10-CM | POA: Diagnosis present

## 2015-05-29 LAB — D-DIMER, QUANTITATIVE (NOT AT ARMC): D-Dimer, Quant: 1.99 ug/mL-FEU — ABNORMAL HIGH (ref 0.00–0.48)

## 2015-05-29 LAB — CBC
HEMATOCRIT: 38.6 % (ref 36.0–46.0)
Hemoglobin: 12.9 g/dL (ref 12.0–15.0)
MCH: 28.4 pg (ref 26.0–34.0)
MCHC: 33.4 g/dL (ref 30.0–36.0)
MCV: 84.8 fL (ref 78.0–100.0)
Platelets: 272 10*3/uL (ref 150–400)
RBC: 4.55 MIL/uL (ref 3.87–5.11)
RDW: 13.6 % (ref 11.5–15.5)
WBC: 8.7 10*3/uL (ref 4.0–10.5)

## 2015-05-29 LAB — BASIC METABOLIC PANEL
ANION GAP: 10 (ref 5–15)
BUN: 9 mg/dL (ref 6–20)
CHLORIDE: 103 mmol/L (ref 101–111)
CO2: 26 mmol/L (ref 22–32)
Calcium: 8.9 mg/dL (ref 8.9–10.3)
Creatinine, Ser: 0.77 mg/dL (ref 0.44–1.00)
GFR calc non Af Amer: >60 mL/min (ref >60)
Glucose, Bld: 117 mg/dL — ABNORMAL HIGH (ref 65–99)
POTASSIUM: 3.6 mmol/L (ref 3.5–5.1)
SODIUM: 139 mmol/L (ref 135–145)

## 2015-05-29 LAB — I-STAT TROPONIN, ED: TROPONIN I, POC: 0 ng/mL (ref 0.00–0.08)

## 2015-05-29 LAB — BRAIN NATRIURETIC PEPTIDE: B Natriuretic Peptide: 55.2 pg/mL (ref 0.0–100.0)

## 2015-05-29 MED ORDER — DIPHENHYDRAMINE HCL 50 MG/ML IJ SOLN
50.0000 mg | Freq: Once | INTRAMUSCULAR | Status: AC
  Administered 2015-05-29: 50 mg via INTRAVENOUS
  Filled 2015-05-29: qty 1

## 2015-05-29 MED ORDER — HYDROCODONE-ACETAMINOPHEN 5-325 MG PO TABS: 2.0000 | ORAL_TABLET | ORAL | Status: DC | PRN

## 2015-05-29 MED ORDER — IOHEXOL 350 MG/ML SOLN
100.0000 mL | Freq: Once | INTRAVENOUS | Status: AC | PRN
  Administered 2015-05-29: 100 mL via INTRAVENOUS

## 2015-05-29 MED ORDER — METHYLPREDNISOLONE SODIUM SUCC 125 MG IJ SOLR
125.0000 mg | Freq: Once | INTRAMUSCULAR | Status: AC
  Administered 2015-05-29: 125 mg via INTRAVENOUS
  Filled 2015-05-29: qty 2

## 2015-05-29 NOTE — ED Notes (Addendum)
Pt here for pain in her chest with breathing. sts woke up out of her sleep feeling startled. sts headache. Sent here by her doctor r/o PE. sts recent travel from the beach.

## 2015-05-29 NOTE — Discharge Instructions (Signed)

## 2015-05-29 NOTE — ED Provider Notes (Signed)
CSN: [id number]     Arrival date & time 05/29/15  1712 History   First MD Initiated Contact with Patient 05/29/15 1736     Chief Complaint  Patient presents with  . Chest Pain     (Consider location/radiation/quality/duration/timing/severity/associated sxs/prior Treatment) HPI Comments: History referred to the emergency room by her doctor to be evaluated for possible pulmonary embolism. Patient reports that she woke up last night and felt startled. She then noticed that she had a pain in her chest. She reports a sharp pain in the chest that occurs when she takes a breath. This has been present throughout the day. She has not felt any palpitations and does not feel particularly short of breath. Patient denies any swelling or pain of the lower extremities. She did recently have a long car trip for vacation.  Patient is a 54 y.o. female presenting with chest pain.  Chest Pain   Past Medical History  Diagnosis Date  . Esophageal stricture     hx. of esophageal dilation x 1  . Arthritis     knees, right shoulder  . Muscle pain   . Depression     no current med.  . Hypertension     under control with med., has been on med. x 5 yr.  . Sleep apnea     uses CPAP nightly  . Carpal tunnel syndrome of left wrist 04/2013  . History of difficult intubation   . Difficult intubation   . Dental crowns present   . GERD (gastroesophageal reflux disease)    Past Surgical History  Procedure Laterality Date  . Tonsillectomy and adenoidectomy    . Hysteroscopy w/d&c  01/28/2001  . Laparoscopic supracervical hysterectomy  12/11/2008  . Bilateral salpingoophorectomy  12/11/2008  . Cystoscopy  12/11/2008  . Incision and drainage perirectal abscess  07/03/2011  . Cholecystectomy  1985  . Esophageal dilation    . Carpal tunnel release Left 05/11/2013    Procedure: CARPAL TUNNEL RELEASE;  Surgeon: Wyn Forsterobert V Sypher Jr., MD;  Location: [facility];  Service: Orthopedics;  Laterality: Left;  .  Heel spur resection Right 05/21/2014    Procedure: HEEL SPUR RESECTION;  Surgeon: Larey DresserMartha Ajlouny, DPM;  [facility];  Service: Podiatry;  Laterality: Right;  mid-foot bone resection   . Excision morton's neuroma Right 05/21/2014    Procedure: EXCISION MORTON'S NEUROMA;  Surgeon: Larey DresserMartha Ajlouny, DPM;  [facility] ;  Service: Podiatry;  Laterality: Right;  neurectomy   Family History  Problem Relation Age of Onset  . COPD Father    History  Substance Use Topics  . Smoking status: Never Smoker   . Smokeless tobacco: Never Used  . Alcohol Use: Yes     Comment: once every 2 months   OB History    No data available     Review of Systems  Cardiovascular: Positive for chest pain.  All other systems reviewed and are negative.     Allergies  Aspirin; Demerol; Nsaids; Other; Salicylates; Dilaudid; and Iohexol  Home Medications   Prior to Admission medications   Medication Sig Start Date End Date Taking? Authorizing Provider  cyclobenzaprine (FLEXERIL) 10 MG tablet  04/12/14   Historical Provider, MD  DULoxetine (CYMBALTA) 30 MG capsule  04/09/14   Historical Provider, MD  erythromycin ophthalmic ointment  04/11/14   Historical Provider, MD  fluconazole (DIFLUCAN) 150 MG tablet  05/12/14   Historical Provider, MD  lisinopril (PRINIVIL,ZESTRIL) 20 MG tablet Take  20 mg by mouth daily.    Historical Provider, MD  oxybutynin (DITROPAN-XL) 5 MG 24 hr tablet  04/26/14   Historical Provider, MD  oxyCODONE-acetaminophen (PERCOCET/ROXICET) 5-325 MG per tablet  05/18/14   Historical Provider, MD  promethazine (PHENERGAN) 50 MG tablet  05/18/14   Historical Provider, MD   BP 101/66 mmHg  Pulse 73  Resp 16  Ht  (1.549 m)  Wt 308 lb 9.6 oz (139.98 kg)  BMI 58.34 kg/m2  SpO2 99% Physical Exam  Constitutional: She is oriented to person, place, and time. She appears well-developed and well-nourished. No distress.  HENT:  Head: Normocephalic and  atraumatic.  Right Ear: Hearing normal.  Left Ear: Hearing normal.  Nose: Nose normal.  Mouth/Throat: Oropharynx is clear and moist and mucous membranes are normal.  Eyes: Conjunctivae and EOM are normal. Pupils are equal, round, and reactive to light.  Neck: Normal range of motion. Neck supple.  Cardiovascular: Regular rhythm, S1 normal and S2 normal.  Exam reveals no gallop and no friction rub.   No murmur heard. Pulmonary/Chest: Effort normal and breath sounds normal. No respiratory distress. She exhibits no tenderness.  Abdominal: Soft. Normal appearance and bowel sounds are normal. There is no hepatosplenomegaly. There is no tenderness. There is no rebound, no guarding, no tenderness at McBurney's point and negative Murphy's sign. No hernia.  Musculoskeletal: Normal range of motion.  Neurological: She is alert and oriented to person, place, and time. She has normal strength. No cranial nerve deficit or sensory deficit. Coordination normal. GCS eye subscore is 4. GCS verbal subscore is 5. GCS motor subscore is 6.  Skin: Skin is warm, dry and intact. No rash noted. No cyanosis.  Psychiatric: She has a normal mood and affect. Her speech is normal and behavior is normal. Thought content normal.  Nursing note and vitals reviewed.   ED Course  Procedures (including critical care time) Labs Review Labs Reviewed  BASIC METABOLIC PANEL - Abnormal; Notable for the following:    Glucose, Bld 117 (*)    All other components within normal limits  D-DIMER, QUANTITATIVE (NOT AT [facility]) - Abnormal; Notable for the following:    D-Dimer, Quant 1.99 (*)    All other components within normal limits  CBC  BRAIN NATRIURETIC PEPTIDE  I-STAT TROPOININ, ED    Imaging Review No results found.   EKG Interpretation   Date/Time:  Wednesday May 29 2015 17:19:55 EDT Ventricular Rate:  83 PR Interval:  150 QRS Duration: 80 QT Interval:  384 QTC Calculation: 451 R Axis:   58 Text Interpretation:   Normal sinus rhythm Normal ECG Confirmed by Venice Marcucci   MD, Shalita Notte ([id number]) on 05/29/2015 6:36:29 PM      MDM   Final diagnoses:  Pleuritic pain  Pleuritic chest pain    she presents to the ER for evaluation of pleuritic chest pain. She is not short of breath nor hypoxic. There is no tachycardia. Patient's felt to be low risk for PE, but she did have a recent long trip by car. D-dimer was elevated, PE could not be ruled out without imaging. Patient does have an iohexol allergy. She was given premedication and CT scan was performed. CT scan did not show any evidence of PE. No acute abnormality noted. Patient reassured, treat with analgesia. Symptoms consistent with musculoskeletal chest pain.  Gilda Crease, MD 05/29/15 2125

## 2015-05-29 NOTE — ED Notes (Signed)
Pt states that she does not want pain medication at this time.  

## 2015-05-31 MED ORDER — IOHEXOL 350 MG/ML SOLN
100.0000 mL | Freq: Once | INTRAVENOUS | Status: AC | PRN
  Administered 2015-05-31: 100 mL via INTRAVENOUS

## 2015-09-24 ENCOUNTER — Ambulatory Visit: Payer: BLUE CROSS/BLUE SHIELD | Admitting: Cardiology

## 2015-11-19 DIAGNOSIS — M79671 Pain in right foot: Secondary | ICD-10-CM | POA: Insufficient documentation

## 2016-01-01 DIAGNOSIS — G5731 Lesion of lateral popliteal nerve, right lower limb: Secondary | ICD-10-CM | POA: Insufficient documentation

## 2017-10-27 ENCOUNTER — Encounter (HOSPITAL_BASED_OUTPATIENT_CLINIC_OR_DEPARTMENT_OTHER): Payer: Self-pay

## 2017-10-27 ENCOUNTER — Other Ambulatory Visit: Payer: Self-pay

## 2017-10-27 ENCOUNTER — Emergency Department (HOSPITAL_BASED_OUTPATIENT_CLINIC_OR_DEPARTMENT_OTHER)
Admission: EM | Admit: 2017-10-27 | Discharge: 2017-10-27 | Disposition: A | Discharge status: Home / Self Care | Payer: BLUE CROSS/BLUE SHIELD | Attending: Emergency Medicine | Admitting: Emergency Medicine

## 2017-10-27 DIAGNOSIS — Y9389 Activity, other specified: Secondary | ICD-10-CM | POA: Diagnosis not present

## 2017-10-27 DIAGNOSIS — M25511 Pain in right shoulder: Secondary | ICD-10-CM

## 2017-10-27 DIAGNOSIS — Y998 Other external cause status: Secondary | ICD-10-CM | POA: Diagnosis not present

## 2017-10-27 DIAGNOSIS — S199XXA Unspecified injury of neck, initial encounter: Secondary | ICD-10-CM | POA: Diagnosis present

## 2017-10-27 DIAGNOSIS — Y92410 Unspecified street and highway as the place of occurrence of the external cause: Secondary | ICD-10-CM | POA: Insufficient documentation

## 2017-10-27 DIAGNOSIS — I10 Essential (primary) hypertension: Secondary | ICD-10-CM | POA: Insufficient documentation

## 2017-10-27 DIAGNOSIS — S161XXA Strain of muscle, fascia and tendon at neck level, initial encounter: Secondary | ICD-10-CM | POA: Diagnosis not present

## 2017-10-27 DIAGNOSIS — Z79899 Other long term (current) drug therapy: Secondary | ICD-10-CM | POA: Diagnosis not present

## 2017-10-27 DIAGNOSIS — M545 Low back pain, unspecified: Secondary | ICD-10-CM

## 2017-10-27 NOTE — Discharge Instructions (Signed)
Take extra strength Tylenol 2-3 times daily as needed Take muscle relaxer at bedtime to help you sleep. This medicine makes you drowsy so do not take before driving or work Use a heating pad for sore muscles - use for 20 minutes several times a day Return for worsening symptoms
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
## 2017-10-27 NOTE — ED Notes (Signed)
ED Provider at bedside. 
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
## 2018-02-11 ENCOUNTER — Other Ambulatory Visit (HOSPITAL_COMMUNITY): Payer: Self-pay | Admitting: Student

## 2018-02-11 ENCOUNTER — Other Ambulatory Visit (HOSPITAL_COMMUNITY): Payer: Self-pay | Admitting: Orthopedic Surgery

## 2018-02-11 DIAGNOSIS — M25561 Pain in right knee: Secondary | ICD-10-CM

## 2018-02-21 ENCOUNTER — Ambulatory Visit (HOSPITAL_COMMUNITY)
Admission: RE | Admit: 2018-02-21 | Discharge: 2018-02-21 | Disposition: A | Discharge status: Home / Self Care | Payer: BLUE CROSS/BLUE SHIELD | Source: Ambulatory Visit | Attending: Orthopedic Surgery | Admitting: Orthopedic Surgery

## 2018-02-21 DIAGNOSIS — X58XXXA Exposure to other specified factors, initial encounter: Secondary | ICD-10-CM | POA: Diagnosis not present

## 2018-02-21 DIAGNOSIS — S83241A Other tear of medial meniscus, current injury, right knee, initial encounter: Secondary | ICD-10-CM | POA: Diagnosis not present

## 2018-02-21 DIAGNOSIS — M2540 Effusion, unspecified joint: Secondary | ICD-10-CM | POA: Diagnosis not present

## 2018-02-21 DIAGNOSIS — M25561 Pain in right knee: Secondary | ICD-10-CM

## 2018-04-28 ENCOUNTER — Other Ambulatory Visit (HOSPITAL_COMMUNITY): Payer: Self-pay | Admitting: Surgery

## 2018-04-28 ENCOUNTER — Other Ambulatory Visit: Payer: Self-pay | Admitting: Surgery

## 2018-04-28 DIAGNOSIS — Z139 Encounter for screening, unspecified: Secondary | ICD-10-CM

## 2018-05-11 ENCOUNTER — Ambulatory Visit (HOSPITAL_COMMUNITY)
Admission: RE | Admit: 2018-05-11 | Discharge: 2018-05-11 | Disposition: A | Discharge status: Home / Self Care | Payer: BLUE CROSS/BLUE SHIELD | Source: Ambulatory Visit | Attending: Surgery | Admitting: Surgery

## 2018-05-11 DIAGNOSIS — R7303 Prediabetes: Secondary | ICD-10-CM | POA: Diagnosis not present

## 2018-05-11 DIAGNOSIS — F329 Major depressive disorder, single episode, unspecified: Secondary | ICD-10-CM | POA: Insufficient documentation

## 2018-05-11 DIAGNOSIS — Z79899 Other long term (current) drug therapy: Secondary | ICD-10-CM | POA: Insufficient documentation

## 2018-05-11 DIAGNOSIS — Z833 Family history of diabetes mellitus: Secondary | ICD-10-CM | POA: Diagnosis not present

## 2018-05-11 DIAGNOSIS — M1990 Unspecified osteoarthritis, unspecified site: Secondary | ICD-10-CM | POA: Insufficient documentation

## 2018-05-11 DIAGNOSIS — Z6841 Body Mass Index (BMI) 40.0 and over, adult: Secondary | ICD-10-CM | POA: Insufficient documentation

## 2018-05-11 DIAGNOSIS — Z8261 Family history of arthritis: Secondary | ICD-10-CM | POA: Insufficient documentation

## 2018-05-11 DIAGNOSIS — Z7984 Long term (current) use of oral hypoglycemic drugs: Secondary | ICD-10-CM | POA: Diagnosis not present

## 2018-05-11 DIAGNOSIS — K219 Gastro-esophageal reflux disease without esophagitis: Secondary | ICD-10-CM | POA: Diagnosis not present

## 2018-05-11 DIAGNOSIS — Z90710 Acquired absence of both cervix and uterus: Secondary | ICD-10-CM | POA: Diagnosis not present

## 2018-05-11 DIAGNOSIS — Z8249 Family history of ischemic heart disease and other diseases of the circulatory system: Secondary | ICD-10-CM | POA: Insufficient documentation

## 2018-05-11 DIAGNOSIS — Z836 Family history of other diseases of the respiratory system: Secondary | ICD-10-CM | POA: Diagnosis not present

## 2018-05-11 DIAGNOSIS — Z811 Family history of alcohol abuse and dependence: Secondary | ICD-10-CM | POA: Diagnosis not present

## 2018-05-11 DIAGNOSIS — K449 Diaphragmatic hernia without obstruction or gangrene: Secondary | ICD-10-CM | POA: Diagnosis not present

## 2018-05-11 DIAGNOSIS — Z9049 Acquired absence of other specified parts of digestive tract: Secondary | ICD-10-CM | POA: Diagnosis not present

## 2018-05-11 DIAGNOSIS — I10 Essential (primary) hypertension: Secondary | ICD-10-CM | POA: Diagnosis not present

## 2018-05-11 DIAGNOSIS — G4730 Sleep apnea, unspecified: Secondary | ICD-10-CM | POA: Insufficient documentation

## 2018-05-18 ENCOUNTER — Encounter: Payer: BLUE CROSS/BLUE SHIELD | Attending: Surgery | Admitting: Registered"

## 2018-05-18 ENCOUNTER — Ambulatory Visit
Admission: RE | Admit: 2018-05-18 | Discharge: 2018-05-18 | Disposition: A | Discharge status: Home / Self Care | Payer: BLUE CROSS/BLUE SHIELD | Source: Ambulatory Visit | Attending: Surgery | Admitting: Surgery

## 2018-05-18 ENCOUNTER — Encounter: Payer: Self-pay | Admitting: Registered"

## 2018-05-18 DIAGNOSIS — Z6841 Body Mass Index (BMI) 40.0 and over, adult: Secondary | ICD-10-CM | POA: Diagnosis not present

## 2018-05-18 DIAGNOSIS — E669 Obesity, unspecified: Secondary | ICD-10-CM

## 2018-05-18 DIAGNOSIS — Z713 Dietary counseling and surveillance: Secondary | ICD-10-CM | POA: Insufficient documentation

## 2018-05-18 DIAGNOSIS — Z139 Encounter for screening, unspecified: Secondary | ICD-10-CM

## 2018-05-18 NOTE — Progress Notes (Signed)
Pre-Op Assessment Visit:  Pre-Operative Sleeve Gastrectomy Surgery  Medical Nutrition Therapy:  Appt start time: 2:00  End time:  3:12  Patient was seen on 05/18/2018 for Pre-Operative Nutrition Assessment. Assessment and letter of approval [facility] coordinator on 05/18/2018.   Pt expectation of surgery: better quality of life, able to tie shoes easier, walk around neighborhood, enjoy shopping and traveling, in position for knee replacement surgery, improve knee pain, trip [place] with [place] without using scooter  Pt expectation of Dietitian: guidance to make her comfortable, knowledge for success  Start weight at [facility]: 328.6 BMI: 62.60   Per referral, surgeon wants her to lose 20-30 lbs from 338 lbs.   Pt states she likes to cook. Pt states she has eliminated soda. Pt states she has 7 grandchildren. Pt states daughter a is Data processing managerdietitian.   Per insurance, pt needs 6 SWL visits prior to surgery. Pt will need Protein Shakes and Vitamins and Mineral Recommendations at next visit.    24 hr Dietary Recall: First Meal: 2 eggs, 2 Tbs bacon bits, toast or grits or 2 slices of toast, peanut butter, and honey Snack: none Second Meal: high fiber tortilla wrap, ground beef, mustard, cheese, romaine lettuce Snack: none Third Meal: [employer] - Asian chicken salad Snack: yogurt covered raisins, chocolate chips, granola Beverages: water with lemon, coffee  Encouraged to engage in 150 minutes of moderate physical activity including cardiovascular and weight baring weekly  Handouts given during visit include:  . Pre-Op Goals  During the appointment today the following Pre-Op Goals were reviewed with the patient: . Track your food and beverage: MyFitness Pal or Baritastic App . Make healthy food choices . Begin to limit portion sizes . Limited concentrated sugars and fried foods . Keep fat/sugar in the single digits per serving on    food  labels . Practice CHEWING your food  (aim for 30 chews per bite or until applesauce consistency) . Practice not drinking 15 minutes before, during, and 30 minutes after each meal/snack . Avoid all carbonated beverages  . Avoid/limit caffeinated beverages  . Avoid all sugar-sweetened beverages . Avoid alcohol . Consume 3 meals per day; eat every 3-5 hours . Make a list of non-food related activities . Aim for 64-100 ounces of FLUID daily  . Aim for at least 60-80 grams of PROTEIN daily . Look for a liquid protein source that contain ?15 g protein and ?5 g carbohydrate  (ex: shakes, drinks, shots) . Physical activity is an important part of a healthy lifestyle so keep it moving!  Follow diet recommendations listed below Energy and Macronutrient Recommendations: Calories: 1800 Carbohydrate: 200 Protein: 135 Fat: 50  Demonstrated degree of understanding via:  Teach Back   Teaching Method Utilized:  Visual Auditory Hands on  Barriers to learning/adherence to lifestyle change: none identified  Patient to call the Nutrition and Diabetes Education Services to enroll in Pre-Op and Post-Op Nutrition Education when surgery date is scheduled.

## 2018-06-07 ENCOUNTER — Encounter: Payer: BLUE CROSS/BLUE SHIELD | Attending: Surgery | Admitting: Registered"

## 2018-06-07 ENCOUNTER — Encounter: Payer: Self-pay | Admitting: Registered"

## 2018-06-07 ENCOUNTER — Ambulatory Visit: Payer: BLUE CROSS/BLUE SHIELD | Admitting: Registered"

## 2018-06-07 DIAGNOSIS — Z6841 Body Mass Index (BMI) 40.0 and over, adult: Secondary | ICD-10-CM | POA: Insufficient documentation

## 2018-06-07 DIAGNOSIS — E669 Obesity, unspecified: Secondary | ICD-10-CM

## 2018-06-07 DIAGNOSIS — Z713 Dietary counseling and surveillance: Secondary | ICD-10-CM | POA: Insufficient documentation

## 2018-06-07 NOTE — Patient Instructions (Addendum)
.   Keep fat/sugar in the single digits per serving on    food labels   Aim for 64-100 ounces of FLUID daily

## 2018-06-07 NOTE — Progress Notes (Signed)
Sleeve Gastrectomy Appt start time: 12:05 end time: 12:30  Assessment: 1st SWL Appointment.   Start Wt at [facility]: 328.6 Wt: 328.5 BMI: 62.58   Pt arrives having maintained weight from previous visit.    MEDICATIONS: See list; no longer taking metformin due to hidradrenitis   DIETARY INTAKE:  24-hr recall:  First Meal: 2 eggs, 2 Tbs bacon bits, toast or grits or 2 slices of toast, peanut butter, and honey Snack: none Second Meal: high fiber tortilla wrap, ground beef, mustard, cheese, romaine lettuce Snack: none Third Meal: [employer] - Asian chicken salad Snack: yogurt covered raisins, chocolate chips, granola Beverages: water with lemon, coffee  Usual physical activity: none stated  Diet to Follow: 1800 calories 200 g carbohydrates 135 g protein 50 g fat  Preferred Learning Style:   No preference indicated   Learning Readiness:   Ready  Change in progress     Nutritional Diagnosis:  [place]-3.3 Overweight/obesity related to past poor dietary habits and physical inactivity as evidenced by patient w/ planned sleeve gastrectomy surgery following dietary guidelines for continued weight loss.    Intervention:  Nutrition counseling for upcoming Bariatric Surgery. Pt was provided education on Protein Shakes and Vitamin and Mineral Recommendations. Pt was in agreement with goals listed.  Goals:   Aim for 150 minutes of physical activity including cardio and weight bearing every week . Keep fat/sugar in the single digits per serving on    food labels  Aim for 64-100 ounces of FLUID daily   Teaching Method Utilized:  Visual Auditory Hands on  Handouts given during visit include:  Protein Shakes  Vitamin and Mineral Recommendations  Barriers to learning/adherence to lifestyle change: none indentified  Demonstrated degree of understanding via:  Teach Back   Monitoring/Evaluation:  Dietary intake, exercise, and body weight in 1 month(s).

## 2018-07-14 ENCOUNTER — Encounter: Payer: Self-pay | Admitting: Registered"

## 2018-07-14 ENCOUNTER — Encounter: Payer: BLUE CROSS/BLUE SHIELD | Attending: Surgery | Admitting: Registered"

## 2018-07-14 DIAGNOSIS — Z713 Dietary counseling and surveillance: Secondary | ICD-10-CM | POA: Insufficient documentation

## 2018-07-14 DIAGNOSIS — Z6841 Body Mass Index (BMI) 40.0 and over, adult: Secondary | ICD-10-CM | POA: Insufficient documentation

## 2018-07-14 DIAGNOSIS — E669 Obesity, unspecified: Secondary | ICD-10-CM

## 2018-07-14 NOTE — Patient Instructions (Addendum)
-   Aim to chew 30 times per bite or to applesauce consistency.   - Keep up with habits already established. Great work!

## 2018-07-14 NOTE — Progress Notes (Signed)
Sleeve Gastrectomy Appt start time: 10:11 end time: 10:25  Assessment: 2nd SWL Appointment.   Start Wt at [facility]: 328.6 Wt: 321.2 BMI: 61.19   Pt arrives having lost 7.3 lbs from previous visit.   Pt states she has started taking a multivitamin and Vitamin D. Pt states she has been doing well reading labels, keeping fat and sugar in single digits per serving. Pt states she has been doing well drinking 64 ounces of water a day; refills 32 ounces cup 4x/day).    Per insurance, pt needs 6 SWL visits prior to surgery. Pt states she was assured she would have surgery before the end of the year.   MEDICATIONS: See list; no longer taking metformin due to hidradrenitis   DIETARY INTAKE:  24-hr recall:  First Meal: english muffin, 2 slices of canadian bacon, 2 slices of cheese Snack: greek yogurt  Second Meal: wheat thins, chicken salad Snack: none Third Meal: rotisserie chicken, instant potatoes, baked vegetables  Snack: sometimes yogurt covered raisins, chocolate chips, granola Beverages: water with lemon (64 oz), coffee, diet coke rarely  Usual physical activity: walking in pool 30 min, 3-4 days/week  Diet to Follow: 1800 calories 200 g carbohydrates 135 g protein 50 g fat  Preferred Learning Style:   No preference indicated   Learning Readiness:   Ready  Change in progress     Nutritional Diagnosis:  [place]-3.3 Overweight/obesity related to past poor dietary habits and physical inactivity as evidenced by patient w/ planned sleeve gastrectomy surgery following dietary guidelines for continued weight loss.    Intervention:  Nutrition counseling for upcoming Bariatric Surgery. Pt was educated and counseled on the benefits of chewing well. Pt was in agreement with goals listed.  Goals:   Aim for 150 minutes of physical activity including cardio and weight bearing every week  Aim to chew 30 times per bite or to applesauce consistency.   Keep up with habits already  established. Great work!  Teaching Method Utilized:  Visual Auditory Hands on  Handouts given during visit include:  none  Barriers to learning/adherence to lifestyle change: none indentified  Demonstrated degree of understanding via:  Teach Back   Monitoring/Evaluation:  Dietary intake, exercise, and body weight in 1 month(s).

## 2018-08-08 ENCOUNTER — Encounter: Payer: BLUE CROSS/BLUE SHIELD | Attending: Surgery | Admitting: Registered"

## 2018-08-08 DIAGNOSIS — Z713 Dietary counseling and surveillance: Secondary | ICD-10-CM | POA: Insufficient documentation

## 2018-08-08 DIAGNOSIS — Z6841 Body Mass Index (BMI) 40.0 and over, adult: Secondary | ICD-10-CM | POA: Diagnosis not present

## 2018-08-08 DIAGNOSIS — E669 Obesity, unspecified: Secondary | ICD-10-CM

## 2018-08-09 ENCOUNTER — Ambulatory Visit: Payer: BLUE CROSS/BLUE SHIELD | Admitting: Registered"

## 2018-08-09 NOTE — Progress Notes (Signed)
  Pre-Operative Nutrition Class:  Appt start time: 8:15   End time:  9:15.  Patient was seen on 08/08/2018 for Pre-Operative Bariatric Surgery Education at [facility].   Surgery date: 08/30/2018 Surgery type: Sleeve Start weight at [facility]: 328.6 Weight today: 320.9   Samples given per MNT protocol. Patient educated on appropriate usage: Bariatric Advantage Multivitamin Lot # [id number] Exp: 08/2019  Bariatric Advantage Calcium Citrate Lot # [id number] Exp:04/21/2019  Bariatric Advantage Calcium Citrate Lot # [id number] Exp: 07/23/2019  Bariatric Advantage Calcium Citrate Lot # [id number] Exp: 12/20/2018  Renee Pain Protein Shake Lot # [id number] Exp: 03/07/2019   The following the learning objectives were met by the patient during this course:  Identify Pre-Op Dietary Goals and will begin 2 weeks pre-operatively  Identify appropriate sources of fluids and proteins   State protein recommendations and appropriate sources pre and post-operatively  Identify Post-Operative Dietary Goals and will follow for 2 weeks post-operatively  Identify appropriate multivitamin and calcium sources  Describe the need for physical activity post-operatively and will follow MD recommendations  State when to call healthcare provider regarding medication questions or post-operative complications  Handouts given during class include:  Pre-Op Bariatric Surgery Diet Handout  Protein Shake Handout  Post-Op Bariatric Surgery Nutrition Handout  [facility] Program Information Flyer  Support Group Information Flyer  [facility] Bariatric Supplements Price List  Follow-Up Plan: Patient will follow-up at [facility] 2 weeks post operatively for diet advancement per MD.

## 2018-08-18 NOTE — Progress Notes (Signed)
Please place orders in Epic as patient is being scheduled for a pre-op appointment! Thank you! 

## 2018-08-22 NOTE — Patient Instructions (Addendum)
Ozzie HoyleRita S Summer  08/22/2018   Your procedure is scheduled on: 08-30-18  [facility]  En[facility]  Report to admitting at 530 AM    Call this number if you have problems the morning of surgery [phone]   Remember: Do not eat food or drink liquids :After Midnight. BRUSH YOUR TEETH MORNING OF SURGERY AND RINSE YOUR MOUTH OUT, NO CHEWING GUM CANDY OR MINTS.     Take these medicines the morning of surgery with A SIP OF WATER: ALLOPURINOL, OMEPRAZOLE (PRILOSEC)                               You may not have any metal on your body including hair pins and              piercings  Do not wear jewelry, make-up, lotions, powders or perfumes, deodorant             Do not wear nail polish.  Do not shave  48 hours prior to surgery.              Men may shave face and neck.   Do not bring valuables to the hospital. [facility] IS NOT             RESPONSIBLE   FOR VALUABLES.  Contacts, dentures or bridgework may not be worn into surgery.  Leave suitcase in the car. After surgery it may be brought to your room.                   Please read over the following fact sheets you were given: _____________________________________________________________________             [facility] - Preparing for Surgery Before surgery, you can play an important role.  Because skin is not sterile, your skin needs to be as free of germs as possible.  You can reduce the number of germs on your skin by washing with CHG (chlorahexidine gluconate) soap before surgery.  CHG is an antiseptic cleaner which kills germs and bonds with the skin to continue killing germs even after washing. Please DO NOT use if you have an allergy to CHG or antibacterial soaps.  If your skin becomes reddened/irritated stop using the CHG and inform your nurse when you arrive at Short Stay. Do not shave (including legs and underarms) for at least 48 hours prior to the first CHG shower.  You may shave your  face/neck. Please follow these instructions carefully:  1.  Shower with CHG Soap the night before surgery and the  morning of Surgery.  2.  If you choose to wash your hair, wash your hair first as usual with your  normal  shampoo.  3.  After you shampoo, rinse your hair and body thoroughly to remove the  shampoo.                           4.  Use CHG as you would any other liquid soap.  You can apply chg directly  to the skin and wash                       Gently with a scrungie or clean washcloth.  5.  Apply the CHG Soap to your body ONLY FROM THE  NECK DOWN.   Do not use on face/ open                           Wound or open sores. Avoid contact with eyes, ears mouth and genitals (private parts).                       Wash face,  Genitals (private parts) with your normal soap.             6.  Wash thoroughly, paying special attention to the area where your surgery  will be performed.  7.  Thoroughly rinse your body with warm water from the neck down.  8.  DO NOT shower/wash with your normal soap after using and rinsing off  the CHG Soap.                9.  Pat yourself dry with a clean towel.            10.  Wear clean pajamas.            11.  Place clean sheets on your bed the night of your first shower and do not  sleep with pets. Day of Surgery : Do not apply any lotions/deodorants the morning of surgery.  Please wear clean clothes to the hospital/surgery center.  FAILURE TO FOLLOW THESE INSTRUCTIONS MAY RESULT IN THE CANCELLATION OF YOUR SURGERY PATIENT SIGNATURE_________________________________  NURSE SIGNATURE__________________________________  ________________________________________________________________________

## 2018-08-25 ENCOUNTER — Other Ambulatory Visit: Payer: Self-pay

## 2018-08-25 ENCOUNTER — Encounter (HOSPITAL_COMMUNITY): Payer: Self-pay

## 2018-08-25 ENCOUNTER — Encounter (HOSPITAL_COMMUNITY)
Admission: RE | Admit: 2018-08-25 | Discharge: 2018-08-25 | Disposition: A | Discharge status: Home / Self Care | Payer: BLUE CROSS/BLUE SHIELD | Source: Ambulatory Visit | Attending: Surgery | Admitting: Surgery

## 2018-08-25 DIAGNOSIS — Z01818 Encounter for other preprocedural examination: Secondary | ICD-10-CM | POA: Diagnosis present

## 2018-08-25 DIAGNOSIS — I10 Essential (primary) hypertension: Secondary | ICD-10-CM | POA: Insufficient documentation

## 2018-08-25 HISTORY — DX: Gout, unspecified: M10.9

## 2018-08-25 HISTORY — DX: Prediabetes: R73.03

## 2018-08-25 HISTORY — DX: Nausea with vomiting, unspecified: R11.2

## 2018-08-25 HISTORY — DX: Personal history of urinary calculi: Z87.442

## 2018-08-25 HISTORY — DX: Other specified postprocedural states: Z98.890

## 2018-08-25 NOTE — Progress Notes (Signed)
DR OSSEY CAN SAW DUE TO HIXTORY OF DIFFICULT INTUBATION. LETTER FROM ANESTHESIA 12-12-2008 INTUBATION ON CHART.

## 2018-08-26 ENCOUNTER — Other Ambulatory Visit (HOSPITAL_COMMUNITY): Payer: Self-pay | Admitting: Surgery

## 2018-08-26 NOTE — Progress Notes (Signed)
SPOKE WITH PATIENT BY PHONE AND PATIENT TO COME FOR LAB WORK AND PICK UP PRE SURGERY SHAKES 08-29-18 AT 1000 AM APPOINTMENT.

## 2018-08-26 NOTE — Progress Notes (Signed)
MORNING OF SURGERY DRINK:  1SHAKE BEFORE YOU LEAVE HOME, DRINK ALL OF THE SHAKE AT ONE TIME.   NO SOLID FOOD AFTER 600 PM THE NIGHT BEFORE YOUR SURGERY. YOU MAY DRINK CLEAR FLUIDS. THE SHAKE YOU DRINK BEFORE YOU LEAVE HOME WILL BE THE LAST FLUIDS YOU DRINK BEFORE SURGERY. DRINK PRE SURGERY SHAKE AT 415 AM. CLEAR LIQUIDS UNTIL 415 AM MORNING OF SURGERY.  PAIN IS EXPECTED AFTER SURGERY AND WILL NOT BE COMPLETELY ELIMINATED. AMBULATION AND TYLENOL WILL HELP REDUCE INCISIONAL AND GAS PAIN. MOVEMENT IS KEY!  YOU ARE EXPECTED TO BE OUT OF BED WITHIN 4 HOURS OF ADMISSION TO YOUR PATIENT ROOM.  SITTING IN THE RECLINER THROUGHOUT THE DAY IS IMPORTANT FOR DRINKING FLUIDS AND MOVING GAS THROUGHOUT THE GI TRACT.  COMPRESSION STOCKINGS SHOULD BE WORN [facility] STAY UNLESS YOU ARE WALKING.   INCENTIVE SPIROMETER SHOULD BE USED EVERY HOUR WHILE AWAKE TO DECREASE POST-OPERATIVE COMPLICATIONS SUCH AS PNEUMONIA.  WHEN DISCHARGED HOME, IT IS IMPORTANT TO CONTINUE TO WALK EVERY HOUR AND USE THE INCENTIVE SPIROMETER EVERY HOUR.     SPOKE WITH PATIENT BY PHONE AND PATIENT WILL RECEIVE COPY OF PRESURGERY SHAKE INSTRUCTIONS WITH LABWORK DONE 08-29-18 AT 1000 AM.

## 2018-08-29 ENCOUNTER — Encounter (HOSPITAL_COMMUNITY)
Admission: RE | Admit: 2018-08-29 | Discharge: 2018-08-29 | Disposition: A | Discharge status: Home / Self Care | Payer: BLUE CROSS/BLUE SHIELD | Source: Ambulatory Visit | Attending: Surgery | Admitting: Surgery

## 2018-08-29 DIAGNOSIS — Z6841 Body Mass Index (BMI) 40.0 and over, adult: Secondary | ICD-10-CM | POA: Insufficient documentation

## 2018-08-29 DIAGNOSIS — Z01818 Encounter for other preprocedural examination: Secondary | ICD-10-CM | POA: Insufficient documentation

## 2018-08-29 LAB — COMPREHENSIVE METABOLIC PANEL
ALBUMIN: 4.4 g/dL (ref 3.5–5.0)
ALT: 17 U/L (ref 0–44)
ANION GAP: 12 (ref 5–15)
AST: 25 U/L (ref 15–41)
Alkaline Phosphatase: 60 U/L (ref 38–126)
BUN: 15 mg/dL (ref 6–20)
CHLORIDE: 108 mmol/L (ref 98–111)
CO2: 26 mmol/L (ref 22–32)
Calcium: 9.7 mg/dL (ref 8.9–10.3)
Creatinine, Ser: 0.62 mg/dL (ref 0.44–1.00)
GFR calc Af Amer: >60 mL/min (ref >60)
GFR calc non Af Amer: >60 mL/min (ref >60)
GLUCOSE: 108 mg/dL — AB (ref 70–99)
POTASSIUM: 4.1 mmol/L (ref 3.5–5.1)
Sodium: 146 mmol/L — ABNORMAL HIGH (ref 135–145)
Total Bilirubin: 0.8 mg/dL (ref 0.3–1.2)
Total Protein: 7.4 g/dL (ref 6.5–8.1)

## 2018-08-29 LAB — CBC WITH DIFFERENTIAL/PLATELET
BASOS ABS: 0 10*3/uL (ref 0.0–0.1)
BASOS PCT: 0 %
EOS ABS: 0.1 10*3/uL (ref 0.0–0.7)
EOS PCT: 2 %
HCT: 40.9 % (ref 36.0–46.0)
Hemoglobin: 13.5 g/dL (ref 12.0–15.0)
Lymphocytes Relative: 19 %
Lymphs Abs: 1.3 10*3/uL (ref 0.7–4.0)
MCH: 28.3 pg (ref 26.0–34.0)
MCHC: 33 g/dL (ref 30.0–36.0)
MCV: 85.7 fL (ref 78.0–100.0)
MONO ABS: 0.4 10*3/uL (ref 0.1–1.0)
Monocytes Relative: 5 %
Neutro Abs: 5.2 10*3/uL (ref 1.7–7.7)
Neutrophils Relative %: 74 %
PLATELETS: 303 10*3/uL (ref 150–400)
RBC: 4.77 MIL/uL (ref 3.87–5.11)
RDW: 14.1 % (ref 11.5–15.5)
WBC: 7 10*3/uL (ref 4.0–10.5)

## 2018-08-29 LAB — ABO/RH: ABO/RH(D): A POS

## 2018-08-29 MED ORDER — BUPIVACAINE LIPOSOME 1.3 % IJ SUSP
20.0000 mL | Freq: Once | INTRAMUSCULAR | Status: DC
  Filled 2018-08-29: qty 20

## 2018-08-29 NOTE — Progress Notes (Signed)
hemaglobin a1c received from [facility] and placed on pt chart

## 2018-08-29 NOTE — H&P (Signed)
[name]  Location: [facility] Patient #: [id number] DOB: Feb 23, 1961 Married / Language: English / Race: White Female  History of Present Illness   The patient is a 57 year old female who presents with a complaint of weight loss surgery.  The PCP is Dr. Ronna Polio  The patient was referred by Dr. Ronna Polio  She comes with her husband.  She has lost 20 pounds in getting ready for surgery - this pushes her weight to under a BMI of 60. Her UGI on 05/18/2018 showed a possible small HH She has seen nutrition - Mirian Capuchin. She has seen Dr. Cyndia Skeeters on 05/12/2018 for psych.  History of weight issues: She viewed the online seminar for weight loss. She is interested in a sleeve gastrectomy. She has a friend at church whom has had a sleeve and done well. She has been overweight her entire life. She remembers taking diet pills as a teenager. She thinks she tried Fen/Phen in the 1990s. She has tried several different diets: Weight Watchers several times, Nutrisystem, low-carb diet, watching categories. She was most successful when she tried Nutrisystem.  Per the 1991 NIH Consensus Statement, the patient is a candidate for bariatric surgery. The patient attended our initial information session and reviewed the types of bariatric surgery.  The patient is interested in the sleeve gastrectomy. I discussed with the patient the indications and risks of bariatric surgery. The potential risks of surgery include, but are not limited to, bleeding, infection, leak from the bowel, DVT and PE, open surgery, long term nutrition consequences, and death. The patient understands the importance of compliance and long term follow-up with our group after surgery. I reviewed with her adding the possible repair of the hiatal hernia to her surgery.  Plan: 1) Consent for surgery, 2) pre op diet (which she is already on), 3) Meds for after surgery, 4) Tentatively  scheduled for sleeve gastrecotmy and possible HH on 08/30/2018  Past Medical History: 1. HTN x 6 years 2. She saw Dr. Anne Fu several years ago for cardiology, but is not followed now. I could not find a note in Epic. 3. Pre diabetic. She takes metformin 4. GERD, mild.  Takes omeparazole. 5. History of open cholecystecotmy  6. Hysterectomy - 2010 - Dr. Henderson Cloud 7. Knee problems, right worse tha left Sees Dr. Turner Daniels 8. History of gout - left big toe 9. History of right axillary hidradinitis. She was Dr. Luisa Hart in 09/18/2013. 10. Right foot surgery - 05/21/2014 11. Perirectal abscess - 07/03/2011 - [place] 12. She saw Dr. Cyndia Skeeters for psych preop  Social History: Married. Her husband works for [employer]. He is a small bearded gentleman. She is not working. 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 She has 2 children: 69 yo an 55 yo. She has 7 grandchildren.  [Note: She has been somewhat abusive to the staff in her pre op eval.]  Medication History (Armen Emelda Fear, CMA; 08/18/2018 1:56 PM) Doxycycline Hyclate (100MG  Capsule, Oral) Active. Furosemide (20MG  Tablet, Oral) Active. Multivitamin Adult (Oral) Active. Vitamin D (400UNIT Tablet, Oral) Active. Lisinopril (20MG  Tablet, Oral) Active. DULoxetine HCl (30MG  Capsule DR Part, Oral) Active. Cyclobenzaprine HCl (10MG  Tablet, Oral) Active. Omeprazole (20MG  Capsule DR, Oral) Active. Allopurinol (100MG  Tablet, Oral) Active. MetFORMIN HCl ER (500MG  Tablet ER 24HR, Oral) Active. Medications Reconciled   Review of Systems Onalee Hua H. Ezzard Standing, MD; 08/18/2018 1:58 PM) General Present- Fatigue and Weight Gain. Not Present- Appetite Loss, Chills, Fever, Night Sweats and Weight Loss. Skin Present-  New Lesions. Not Present- Change in Wart/Mole, Dryness, Hives, Jaundice, Non-Healing Wounds, Rash and Ulcer. HEENT Present- Wears glasses/contact lenses. Not Present- Earache, Hearing Loss, Hoarseness, Nose  Bleed, Oral Ulcers, Ringing in the Ears, Seasonal Allergies, Sinus Pain, Sore Throat, Visual Disturbances and Yellow Eyes. Cardiovascular Present- Swelling of Extremities. Not Present- Chest Pain, Difficulty Breathing Lying Down, Leg Cramps, Palpitations, Rapid Heart Rate and Shortness of Breath. Gastrointestinal Present- Abdominal Pain, Bloating and Constipation. Not Present- Bloody Stool, Change in Bowel Habits, Chronic diarrhea, Difficulty Swallowing, Excessive gas, Gets full quickly at meals, Hemorrhoids, Indigestion, Nausea, Rectal Pain and Vomiting. Musculoskeletal Present- Joint Pain, Joint Stiffness, Muscle Pain and Swelling of Extremities. Not Present- Back Pain and Muscle Weakness. Psychiatric Present- Depression. Not Present- Anxiety, Bipolar, Change in Sleep Pattern, Fearful and Frequent crying.  Vitals (Armen Ferguson CMA; 08/18/2018 1:55 PM) 08/18/2018 1:54 PM Weight: 313.25 lb Height: 61in Body Surface Area: 2.29 m Body Mass Index: 59.19 kg/m  Temp.: 99.34F  Pulse: 87 (Regular)  P.OX: 96% (Room air) BP: 142/82 (Sitting, Left Arm, Standard)  Physical Exam  General: Very obese WF who is alert. Skin: Inspection and palpation of the skin unremarkable.  Eyes: Conjunctivae white, pupils equal. Face, ears, nose, mouth, and throat: Face - normal. Normal ears and nose. Lips and teeth normal.  Neck: Supple. No mass. Trachea midline. No thyroid mass.  Lymph Nodes: No supraclavicular or cervical adenopathy. No axillary adenopathy.  Lungs: Normal respiratory effort. Clear to auscultation and symmetric breath sounds. Cardiovascular: Regular rate and rythm. Normal auscultation of the heart. No murmur or rub.  Abdomen: Soft. No mass. Liver and spleen not palpable. No tenderness. No hernia. Normal bowel sounds.  She is more apple than pear. In fact, her body habitus is more like a female.  Rectal: Not done.  Musculoskeletal/extremities: Good strength  and ROM in upper and lower extremities.   Neurologic: Grossly intact to motor and sensory function.   Psychiatric: Has normal mood and affect. Judgement and insight appear normal.  Assessment & Plan  1.  OBESITY, MORBID, BMI 50 OR HIGHER (E66.01)  Plan:   1) Pre op diet (which she is already on),   2) Meds for after surgery,   3) Tentatively scheduled for sleeve gastrecotmy and possible HH on 08/30/2018  2.  HYPERTENSION, BENIGN (I10)  x 6 years 3.  GOUT (M10.9)  - left big toe 4.  DIFFICULT AIRWAY FOR INTUBATION (T88.4XXA)  Impression: She produced a paper that said she was a difficult intubation.  5. She saw Dr. Anne Fu several years ago for cardiology, but is not followed now. I could not find a note in Epic. 6. Pre diabetic. She takes metformin 7. GERD, mild.  Takes omeparazole. 8. Knee problems, right worse tha left Sees Dr. Alcario Drought, MD, [facility] Surgery Pager: [phone] Office phone:  [phone]

## 2018-08-30 ENCOUNTER — Inpatient Hospital Stay (HOSPITAL_COMMUNITY): Payer: BLUE CROSS/BLUE SHIELD | Admitting: Registered Nurse

## 2018-08-30 ENCOUNTER — Inpatient Hospital Stay (HOSPITAL_COMMUNITY)
Admission: RE | Admit: 2018-08-30 | Discharge: 2018-08-31 | DRG: 621 | Disposition: A | Discharge status: Home / Self Care | Payer: BLUE CROSS/BLUE SHIELD | Attending: Surgery | Admitting: Surgery

## 2018-08-30 ENCOUNTER — Encounter (HOSPITAL_COMMUNITY)
Admission: RE | Disposition: A | Discharge status: Home / Self Care | Payer: Self-pay | Source: Home / Self Care | Attending: Surgery

## 2018-08-30 ENCOUNTER — Encounter (HOSPITAL_COMMUNITY): Payer: Self-pay

## 2018-08-30 DIAGNOSIS — G4730 Sleep apnea, unspecified: Secondary | ICD-10-CM | POA: Diagnosis present

## 2018-08-30 DIAGNOSIS — K219 Gastro-esophageal reflux disease without esophagitis: Secondary | ICD-10-CM | POA: Diagnosis present

## 2018-08-30 DIAGNOSIS — Z79899 Other long term (current) drug therapy: Secondary | ICD-10-CM

## 2018-08-30 DIAGNOSIS — M109 Gout, unspecified: Secondary | ICD-10-CM | POA: Diagnosis present

## 2018-08-30 DIAGNOSIS — Z91041 Radiographic dye allergy status: Secondary | ICD-10-CM | POA: Diagnosis not present

## 2018-08-30 DIAGNOSIS — Z90710 Acquired absence of both cervix and uterus: Secondary | ICD-10-CM

## 2018-08-30 DIAGNOSIS — Z7984 Long term (current) use of oral hypoglycemic drugs: Secondary | ICD-10-CM

## 2018-08-30 DIAGNOSIS — Z973 Presence of spectacles and contact lenses: Secondary | ICD-10-CM | POA: Diagnosis not present

## 2018-08-30 DIAGNOSIS — Z91048 Other nonmedicinal substance allergy status: Secondary | ICD-10-CM

## 2018-08-30 DIAGNOSIS — Z888 Allergy status to other drugs, medicaments and biological substances status: Secondary | ICD-10-CM | POA: Diagnosis not present

## 2018-08-30 DIAGNOSIS — R7303 Prediabetes: Secondary | ICD-10-CM | POA: Diagnosis present

## 2018-08-30 DIAGNOSIS — Z886 Allergy status to analgesic agent status: Secondary | ICD-10-CM | POA: Diagnosis not present

## 2018-08-30 DIAGNOSIS — F329 Major depressive disorder, single episode, unspecified: Secondary | ICD-10-CM | POA: Diagnosis present

## 2018-08-30 DIAGNOSIS — Z6841 Body Mass Index (BMI) 40.0 and over, adult: Secondary | ICD-10-CM | POA: Diagnosis not present

## 2018-08-30 DIAGNOSIS — I10 Essential (primary) hypertension: Secondary | ICD-10-CM | POA: Diagnosis present

## 2018-08-30 DIAGNOSIS — T884XXA Failed or difficult intubation, initial encounter: Secondary | ICD-10-CM | POA: Diagnosis present

## 2018-08-30 HISTORY — PX: LAPAROSCOPIC GASTRIC SLEEVE RESECTION: SHX5895

## 2018-08-30 LAB — TYPE AND SCREEN
ABO/RH(D): A POS
Antibody Screen: NEGATIVE

## 2018-08-30 LAB — PREGNANCY, URINE: Preg Test, Ur: NEGATIVE

## 2018-08-30 LAB — HEMOGLOBIN AND HEMATOCRIT, BLOOD
HEMATOCRIT: 43.5 % (ref 36.0–46.0)
Hemoglobin: 14.3 g/dL (ref 12.0–15.0)

## 2018-08-30 SURGERY — GASTRECTOMY, SLEEVE, LAPAROSCOPIC
Anesthesia: General | Site: Abdomen

## 2018-08-30 MED ORDER — CHLORHEXIDINE GLUCONATE 4 % EX LIQD: 60.0000 mL | Freq: Once | CUTANEOUS | Status: DC

## 2018-08-30 MED ORDER — PROPOFOL 10 MG/ML IV BOLUS
INTRAVENOUS | Status: AC
  Filled 2018-08-30: qty 20

## 2018-08-30 MED ORDER — LIDOCAINE 20MG/ML (2%) 15 ML SYRINGE OPTIME
INTRAMUSCULAR | Status: DC | PRN
  Administered 2018-08-30: 1.5 mg/kg/h via INTRAVENOUS

## 2018-08-30 MED ORDER — SUCCINYLCHOLINE CHLORIDE 200 MG/10ML IV SOSY
PREFILLED_SYRINGE | INTRAVENOUS | Status: DC | PRN
  Administered 2018-08-30: 80 mg via INTRAVENOUS

## 2018-08-30 MED ORDER — ONDANSETRON HCL 4 MG/2ML IJ SOLN
INTRAMUSCULAR | Status: DC | PRN
  Administered 2018-08-30: 4 mg via INTRAVENOUS

## 2018-08-30 MED ORDER — PROPOFOL 10 MG/ML IV BOLUS
INTRAVENOUS | Status: AC
  Filled 2018-08-30: qty 40

## 2018-08-30 MED ORDER — OXYCODONE HCL 5 MG PO TABS: 5.0000 mg | ORAL_TABLET | Freq: Once | ORAL | Status: DC | PRN

## 2018-08-30 MED ORDER — PROPOFOL 10 MG/ML IV BOLUS
INTRAVENOUS | Status: AC
  Filled 2018-08-30: qty 60

## 2018-08-30 MED ORDER — ONDANSETRON HCL 4 MG/2ML IJ SOLN
INTRAMUSCULAR | Status: AC
  Filled 2018-08-30: qty 2

## 2018-08-30 MED ORDER — DEXAMETHASONE SODIUM PHOSPHATE 10 MG/ML IJ SOLN
INTRAMUSCULAR | Status: DC | PRN
  Administered 2018-08-30: 8 mg via INTRAVENOUS

## 2018-08-30 MED ORDER — FENTANYL CITRATE (PF) 100 MCG/2ML IJ SOLN
INTRAMUSCULAR | Status: AC
  Administered 2018-08-30: 25 ug via INTRAVENOUS
  Filled 2018-08-30: qty 2

## 2018-08-30 MED ORDER — OXYCODONE HCL 5 MG/5ML PO SOLN
5.0000 mg | Freq: Once | ORAL | Status: DC | PRN
  Filled 2018-08-30: qty 5

## 2018-08-30 MED ORDER — MIDAZOLAM HCL 2 MG/2ML IJ SOLN
INTRAMUSCULAR | Status: AC
  Filled 2018-08-30: qty 2

## 2018-08-30 MED ORDER — SCOPOLAMINE 1 MG/3DAYS TD PT72
MEDICATED_PATCH | TRANSDERMAL | Status: DC | PRN
  Administered 2018-08-30: 1 via TRANSDERMAL

## 2018-08-30 MED ORDER — DULOXETINE HCL 30 MG PO CPEP: 30.0000 mg | ORAL_CAPSULE | Freq: Every evening | ORAL | Status: DC

## 2018-08-30 MED ORDER — PROPOFOL 10 MG/ML IV BOLUS
INTRAVENOUS | Status: DC | PRN
  Administered 2018-08-30: 60 mg via INTRAVENOUS
  Administered 2018-08-30: 140 mg via INTRAVENOUS

## 2018-08-30 MED ORDER — ONDANSETRON HCL 4 MG/2ML IJ SOLN
4.0000 mg | INTRAMUSCULAR | Status: DC | PRN
  Administered 2018-08-30: 4 mg via INTRAVENOUS

## 2018-08-30 MED ORDER — SCOPOLAMINE 1 MG/3DAYS TD PT72
MEDICATED_PATCH | TRANSDERMAL | Status: AC
  Filled 2018-08-30: qty 1

## 2018-08-30 MED ORDER — LIDOCAINE 2% (20 MG/ML) 5 ML SYRINGE
INTRAMUSCULAR | Status: DC | PRN
  Administered 2018-08-30: 80 mg via INTRAVENOUS

## 2018-08-30 MED ORDER — FENTANYL CITRATE (PF) 100 MCG/2ML IJ SOLN
25.0000 ug | INTRAMUSCULAR | Status: DC | PRN
  Administered 2018-08-30 (×2): 25 ug via INTRAVENOUS

## 2018-08-30 MED ORDER — ROCURONIUM BROMIDE 10 MG/ML (PF) SYRINGE
PREFILLED_SYRINGE | INTRAVENOUS | Status: AC
  Filled 2018-08-30: qty 10

## 2018-08-30 MED ORDER — LACTATED RINGERS IV SOLN
INTRAVENOUS | Status: DC
  Administered 2018-08-30 (×2): via INTRAVENOUS

## 2018-08-30 MED ORDER — GABAPENTIN 300 MG PO CAPS
300.0000 mg | ORAL_CAPSULE | ORAL | Status: AC
  Administered 2018-08-30: 300 mg via ORAL
  Filled 2018-08-30: qty 1

## 2018-08-30 MED ORDER — DEXAMETHASONE SODIUM PHOSPHATE 10 MG/ML IJ SOLN
INTRAMUSCULAR | Status: AC
  Filled 2018-08-30: qty 1

## 2018-08-30 MED ORDER — SODIUM CHLORIDE 0.9 % IV SOLN
2.0000 g | INTRAVENOUS | Status: AC
  Administered 2018-08-30: 2 g via INTRAVENOUS
  Filled 2018-08-30: qty 2

## 2018-08-30 MED ORDER — LACTATED RINGERS IR SOLN
Status: DC | PRN
  Administered 2018-08-30: 2000 mL

## 2018-08-30 MED ORDER — BUPIVACAINE LIPOSOME 1.3 % IJ SUSP
INTRAMUSCULAR | Status: DC | PRN
  Administered 2018-08-30: 20 mL

## 2018-08-30 MED ORDER — BUPIVACAINE-EPINEPHRINE (PF) 0.25% -1:200000 IJ SOLN
INTRAMUSCULAR | Status: AC
  Filled 2018-08-30: qty 30

## 2018-08-30 MED ORDER — TISSEEL VH 10 ML EX KIT
PACK | CUTANEOUS | Status: AC
  Filled 2018-08-30: qty 1

## 2018-08-30 MED ORDER — FAMOTIDINE IN NACL 20-0.9 MG/50ML-% IV SOLN
20.0000 mg | Freq: Two times a day (BID) | INTRAVENOUS | Status: DC
  Administered 2018-08-30 – 2018-08-31 (×2): 20 mg via INTRAVENOUS
  Filled 2018-08-30 (×2): qty 50

## 2018-08-30 MED ORDER — FENTANYL CITRATE (PF) 250 MCG/5ML IJ SOLN
INTRAMUSCULAR | Status: AC
  Filled 2018-08-30: qty 5

## 2018-08-30 MED ORDER — ACETAMINOPHEN 160 MG/5ML PO SOLN
650.0000 mg | Freq: Four times a day (QID) | ORAL | Status: DC
  Administered 2018-08-30 – 2018-08-31 (×3): 650 mg via ORAL
  Filled 2018-08-30 (×3): qty 20.3

## 2018-08-30 MED ORDER — OXYCODONE HCL 5 MG/5ML PO SOLN: 5.0000 mg | ORAL | Status: DC | PRN

## 2018-08-30 MED ORDER — SUGAMMADEX SODIUM 500 MG/5ML IV SOLN
INTRAVENOUS | Status: AC
  Filled 2018-08-30: qty 5

## 2018-08-30 MED ORDER — POTASSIUM CHLORIDE IN NACL 20-0.45 MEQ/L-% IV SOLN
INTRAVENOUS | Status: DC
  Administered 2018-08-30 – 2018-08-31 (×2): via INTRAVENOUS
  Filled 2018-08-30 (×3): qty 1000

## 2018-08-30 MED ORDER — LIDOCAINE 2% (20 MG/ML) 5 ML SYRINGE
INTRAMUSCULAR | Status: AC
  Filled 2018-08-30: qty 5

## 2018-08-30 MED ORDER — SUGAMMADEX SODIUM 500 MG/5ML IV SOLN
INTRAVENOUS | Status: DC | PRN
  Administered 2018-08-30: 300 mg via INTRAVENOUS

## 2018-08-30 MED ORDER — ALLOPURINOL 100 MG PO TABS
100.0000 mg | ORAL_TABLET | Freq: Every day | ORAL | Status: DC
  Administered 2018-08-31: 100 mg via ORAL
  Filled 2018-08-30: qty 1

## 2018-08-30 MED ORDER — ACETAMINOPHEN 500 MG PO TABS
1000.0000 mg | ORAL_TABLET | ORAL | Status: AC
  Administered 2018-08-30: 1000 mg via ORAL
  Filled 2018-08-30: qty 2

## 2018-08-30 MED ORDER — ROCURONIUM BROMIDE 10 MG/ML (PF) SYRINGE
PREFILLED_SYRINGE | INTRAVENOUS | Status: DC | PRN
  Administered 2018-08-30: 20 mg via INTRAVENOUS
  Administered 2018-08-30: 50 mg via INTRAVENOUS

## 2018-08-30 MED ORDER — FENTANYL CITRATE (PF) 250 MCG/5ML IJ SOLN
INTRAMUSCULAR | Status: DC | PRN
  Administered 2018-08-30: 100 ug via INTRAVENOUS
  Administered 2018-08-30: 50 ug via INTRAVENOUS

## 2018-08-30 MED ORDER — PREMIER PROTEIN SHAKE
2.0000 [oz_av] | ORAL | Status: DC
  Administered 2018-08-31: 2 [oz_av] via ORAL

## 2018-08-30 MED ORDER — KETAMINE HCL 10 MG/ML IJ SOLN
INTRAMUSCULAR | Status: AC
  Filled 2018-08-30: qty 1

## 2018-08-30 MED ORDER — BUPIVACAINE-EPINEPHRINE (PF) 0.5% -1:200000 IJ SOLN
INTRAMUSCULAR | Status: AC
  Filled 2018-08-30: qty 30

## 2018-08-30 MED ORDER — BUPIVACAINE-EPINEPHRINE 0.25% -1:200000 IJ SOLN
INTRAMUSCULAR | Status: DC | PRN
  Administered 2018-08-30: 30 mL

## 2018-08-30 MED ORDER — TISSEEL VH 10 ML EX KIT
PACK | CUTANEOUS | Status: DC | PRN
  Administered 2018-08-30: 1

## 2018-08-30 MED ORDER — PROMETHAZINE HCL 25 MG/ML IJ SOLN
INTRAMUSCULAR | Status: AC
  Filled 2018-08-30: qty 1

## 2018-08-30 MED ORDER — HEPARIN SODIUM (PORCINE) 5000 UNIT/ML IJ SOLN
5000.0000 [IU] | Freq: Three times a day (TID) | INTRAMUSCULAR | Status: DC
  Administered 2018-08-30 – 2018-08-31 (×3): 5000 [IU] via SUBCUTANEOUS
  Filled 2018-08-30 (×3): qty 1

## 2018-08-30 MED ORDER — EPHEDRINE SULFATE-NACL 50-0.9 MG/10ML-% IV SOSY
PREFILLED_SYRINGE | INTRAVENOUS | Status: DC | PRN
  Administered 2018-08-30: 10 mg via INTRAVENOUS
  Administered 2018-08-30: 5 mg via INTRAVENOUS

## 2018-08-30 MED ORDER — EPHEDRINE 5 MG/ML INJ
INTRAVENOUS | Status: AC
  Filled 2018-08-30: qty 10

## 2018-08-30 MED ORDER — MORPHINE SULFATE (PF) 4 MG/ML IV SOLN: 1.0000 mg | INTRAVENOUS | Status: DC | PRN

## 2018-08-30 MED ORDER — MIDAZOLAM HCL 5 MG/5ML IJ SOLN
INTRAMUSCULAR | Status: DC | PRN
  Administered 2018-08-30: 2 mg via INTRAVENOUS

## 2018-08-30 MED ORDER — STERILE WATER FOR IRRIGATION IR SOLN
Status: DC | PRN
  Administered 2018-08-30: 2000 mL

## 2018-08-30 MED ORDER — APREPITANT 40 MG PO CAPS
40.0000 mg | ORAL_CAPSULE | ORAL | Status: AC
  Administered 2018-08-30: 40 mg via ORAL
  Filled 2018-08-30: qty 1

## 2018-08-30 MED ORDER — PROMETHAZINE HCL 25 MG/ML IJ SOLN
6.2500 mg | INTRAMUSCULAR | Status: DC | PRN
  Administered 2018-08-30: 6.25 mg via INTRAVENOUS

## 2018-08-30 MED ORDER — 0.9 % SODIUM CHLORIDE (POUR BTL) OPTIME
TOPICAL | Status: DC | PRN
  Administered 2018-08-30: 1000 mL

## 2018-08-30 MED ORDER — PROPOFOL 500 MG/50ML IV EMUL
INTRAVENOUS | Status: DC | PRN
  Administered 2018-08-30: 100 ug/kg/min via INTRAVENOUS

## 2018-08-30 MED ORDER — HEPARIN SODIUM (PORCINE) 5000 UNIT/ML IJ SOLN
5000.0000 [IU] | INTRAMUSCULAR | Status: AC
  Administered 2018-08-30: 5000 [IU] via SUBCUTANEOUS
  Filled 2018-08-30: qty 1

## 2018-08-30 MED ORDER — KETAMINE HCL 10 MG/ML IJ SOLN
INTRAMUSCULAR | Status: DC | PRN
  Administered 2018-08-30 (×2): 25 mg via INTRAVENOUS

## 2018-08-30 MED ORDER — LIDOCAINE HCL 2 % IJ SOLN
INTRAMUSCULAR | Status: AC
  Filled 2018-08-30: qty 20

## 2018-08-30 MED ORDER — LISINOPRIL 20 MG PO TABS
20.0000 mg | ORAL_TABLET | Freq: Every day | ORAL | Status: DC
  Administered 2018-08-31: 20 mg via ORAL
  Filled 2018-08-30: qty 1

## 2018-08-30 SURGICAL SUPPLY — 59 items
APPLICATOR COTTON TIP 6 STRL (MISCELLANEOUS) IMPLANT
APPLICATOR COTTON TIP 6IN STRL (MISCELLANEOUS)
APPLIER CLIP ROT 10 11.4 M/L (STAPLE)
APPLIER CLIP ROT 13.4 12 LRG (CLIP) ×3
BLADE SURG 15 STRL LF DISP TIS (BLADE) ×1 IMPLANT
BLADE SURG 15 STRL SS (BLADE) ×2
CABLE HIGH FREQUENCY MONO STRZ (ELECTRODE) IMPLANT
CHLORAPREP W/TINT 26ML (MISCELLANEOUS) ×6 IMPLANT
CLIP APPLIE ROT 10 11.4 M/L (STAPLE) IMPLANT
CLIP APPLIE ROT 13.4 12 LRG (CLIP) ×1 IMPLANT
DERMABOND ADVANCED (GAUZE/BANDAGES/DRESSINGS) ×2
DERMABOND ADVANCED .7 DNX12 (GAUZE/BANDAGES/DRESSINGS) ×1 IMPLANT
DEVICE SUT QUICK LOAD TK 5 (STAPLE) IMPLANT
DEVICE SUT TI-KNOT TK 5X26 (MISCELLANEOUS) IMPLANT
DEVICE SUTURE ENDOST 10MM (ENDOMECHANICALS) IMPLANT
DEVICE TI KNOT TK5 (MISCELLANEOUS)
DISSECTOR BLUNT TIP ENDO 5MM (MISCELLANEOUS) IMPLANT
DRAPE UTILITY XL STRL (DRAPES) ×6 IMPLANT
ELECT PENCIL ROCKER SW 15FT (MISCELLANEOUS) ×3 IMPLANT
ELECT REM PT RETURN 15FT ADLT (MISCELLANEOUS) ×3 IMPLANT
GLOVE SURG SIGNA 7.5 PF LTX (GLOVE) ×3 IMPLANT
GOWN STRL REUS W/TWL XL LVL3 (GOWN DISPOSABLE) ×9 IMPLANT
GRASPER SUT TROCAR 14GX15 (MISCELLANEOUS) ×3 IMPLANT
HOVERMATT SINGLE USE (MISCELLANEOUS) ×3 IMPLANT
KIT BASIN OR (CUSTOM PROCEDURE TRAY) ×3 IMPLANT
MARKER SKIN DUAL TIP RULER LAB (MISCELLANEOUS) ×3 IMPLANT
NEEDLE SPNL 22GX3.5 QUINCKE BK (NEEDLE) ×6 IMPLANT
PACK UNIVERSAL I (CUSTOM PROCEDURE TRAY) ×3 IMPLANT
QUICK LOAD TK 5 (STAPLE)
RELOAD STAPLER BLUE 60MM (STAPLE) IMPLANT
RELOAD STAPLER GOLD 60MM (STAPLE) ×3 IMPLANT
RELOAD STAPLER GREEN 60MM (STAPLE) ×3 IMPLANT
SCISSORS LAP 5X35 DISP (ENDOMECHANICALS) ×3 IMPLANT
SEALANT SURGICAL APPL DUAL CAN (MISCELLANEOUS) ×3 IMPLANT
SET IRRIG TUBING LAPAROSCOPIC (IRRIGATION / IRRIGATOR) ×3 IMPLANT
SHEARS HARMONIC ACE PLUS 45CM (MISCELLANEOUS) ×3 IMPLANT
SLEEVE ADV FIXATION 5X100MM (TROCAR) ×6 IMPLANT
SLEEVE GASTRECTOMY 36FR VISIGI (MISCELLANEOUS) ×3 IMPLANT
SOLUTION ANTI FOG 6CC (MISCELLANEOUS) ×3 IMPLANT
SPONGE LAP 18X18 RF (DISPOSABLE) ×3 IMPLANT
STAPLER ECHELON LONG 60 440 (INSTRUMENTS) ×3 IMPLANT
STAPLER RELOAD BLUE 60MM (STAPLE)
STAPLER RELOAD GOLD 60MM (STAPLE) ×9
STAPLER RELOAD GREEN 60MM (STAPLE) ×9
SUT MNCRL AB 4-0 PS2 18 (SUTURE) ×3 IMPLANT
SUT SURGIDAC NAB ES-9 0 48 120 (SUTURE) IMPLANT
SUT VICRYL 0 TIES 12 18 (SUTURE) ×3 IMPLANT
SYR 10ML ECCENTRIC (SYRINGE) ×3 IMPLANT
SYR CONTROL 10ML LL (SYRINGE) ×3 IMPLANT
TOWEL OR 17X26 10 PK STRL BLUE (TOWEL DISPOSABLE) ×3 IMPLANT
TOWEL OR NON WOVEN STRL DISP B (DISPOSABLE) ×3 IMPLANT
TROCAR ADV FIXATION 12X100MM (TROCAR) ×3 IMPLANT
TROCAR ADV FIXATION 5X100MM (TROCAR) ×3 IMPLANT
TROCAR BLADELESS 15MM (ENDOMECHANICALS) ×3 IMPLANT
TROCAR BLADELESS OPT 5 100 (ENDOMECHANICALS) ×3 IMPLANT
TUBE CALIBRATION LAPBAND (TUBING) ×3 IMPLANT
TUBING CONNECTING 10 (TUBING) ×4 IMPLANT
TUBING CONNECTING 10' (TUBING) ×2
TUBING INSUF HEATED (TUBING) ×3 IMPLANT

## 2018-08-30 NOTE — Op Note (Signed)
PATIENT:   [name] DOB:   [birth year] MRN:   [id number]  DATE OF PROCEDURE: 08/30/2018                   FACILITY:  [facility]  OPERATIVE REPORT  PREOPERATIVE DIAGNOSIS:  Morbid obesity.  POSTOPERATIVE DIAGNOSIS:  Morbid obesity (weight 313, BMI of 59.2).  PROCEDURE:  Laparoscopic sleeve gastrectomy (intraoperative upper endoscopy by Sheron Nightingale, M.D.)  SURGEON:  Sandria Bales. Ezzard Standing, MD  FIRST ASSISTANTSheron Nightingale, M.D.  ANESTHESIA:  General endotracheal.  Anesthesiologist: Val Eagle, MD CRNA: Elisabeth Cara, CRNA; Jhonnie Garner, CRNA  General  ESTIMATED BLOOD LOSS:  Minimal.  LOCAL ANESTHESIA:  30 cc of 1/4% Marcaine and 20 cc of Exparel  COMPLICATIONS:  None.  INDICATION FOR SURGERY:  [name] is a 57 y.o. white female who sees Juluis Rainier, MD as her primary care doctor.  She has completed our preoperative bariatric program and now comes for a laparoscopic sleeve gastrectomy.  The indications, potential complications of surgery were explained to the patient.  Potential complications of the surgery include, but are not limited to, bleeding, infection, DVT, open surgery, and long-term nutritional consequences.  OPERATIVE NOTE:  The patient taken to room #1 at [facility] where [name] underwent a general endotracheal anesthetic, supervised by Anesthesiologist: Val Eagle, MD CRNA: Elisabeth Cara, CRNA; Jhonnie Garner, CRNA.  The patient was given 2 g of cefotetan at the beginning of the procedure.  A time-out was held and surgical checklist run.  I accessed her abdominal cavity through the left upper quadrant with a 5 mm Optiview. I did an abdominal exploration.   Her omentum and bowel were unremarkable. The right and left lobes of the liver unremarkable. Gallbladder was normal. Her stomach was unremarkable.   I placed a total of 6 trocars. I placed a 5 mm left lateral trocar, a 5 mm left paramedian trocar (for the scope), a 12 mm right paramedian  torcar, a 5 mm right subcostal trocar that I converted to a 15 mm to extract the stomach and 5 mm subxiphoid trocar for the liver retractor.  I placed a abdominal wall anesthetic block using a mixture of 1/4% Marcaine and Exparel.  I used 20 cc per side, for a total of 40 cc.  She had adhesions from her prior abdominal surgery.  I spent about 15 minutes taking down adhesions in the right subcostal area where she had had the prior open cholecystectomy.  She has a hernia at the umbilicus or just below and to the right of the umbilicus.  This has omentum in it.  I did not take this omentum down.  Her preop UGI suggested a small hiatal hernia.  I tested the hiatus with the balloon sizer.  I put 10 cc of air in the balloon, but the balloon held up at the hiatus, so there was no hernia to repair.  I started out taking down the greater curvature attachments of the stomach. I measured approximately 6 cm proximal from the pylorus and mobilized the greater curvature of the stomach with the Harmonic Scalpel. I took this dissection cranially around the greater curvature of her stomach to the angle of His and the left crus.   After I had mobilized the greater curvature of the stomach, I then passed the 36 French ViSiGi bougie which was used to suck the stomach up against the lesser curvature and placed into the antrum. During the staple firing,  I tried to give the ViSiGi a cuff at least about 1 cm. I tried to avoid narrowing the incisura. I used a total of 6 staple firings.  From antrum to the angle of His, I used 3 green, 3 gold and 0 blue Eschelon 60 mm Ethicon staplers. I did not use staple line reinforcement.   At each firing of the EndoGIA stapler, I inspected the stomach, anterior wall of the stomach, and underneath to make sure there was no compromise or impingement on to the ViSiGi bougie.   The staple line seemed linear without any corkscrewing of the stomach. Hemostasis was good. I did not use any  reinforcement. She did have at least 3 areas of bleeding which I used clips to clip on the new greater curvature of the stomach.  Because I thought we had a good staple line, I then had the ViSiGi was converted to insufflate the pouch. The new stomach pouch was placed under water. There was no bubbling or leak noted.   At this point, Dr. Daphine Deutscher broke scrub and passed an upper endoscope down through the esophagus into the stomach pouch.  The EG junction was at about 39 cm.  The stomach was tubular. There was no narrowing of the stomach pouch or angulation. We were easily able to pass the endoscope into the antrum and again put air pressure on the staple line. I irrigated the upper abdomen with saline. There was no bubbling or evidence of air leak. The mucosa looked viable. Dr. Daphine Deutscher decompressed the stomach with the endoscopy.   I converted to right subcostal trocar to a 15 trocar and extracted the stomach remnant through this intact and sent this to Pathology. I then placed 10 cc of Tisseel along the new greater curvature staple line and covered the entire staple line with the Tisseel.  I aspirated out the saline that I had irrigated because I thought the staple line looked viable and complete. There was no evidence of leak. I did not leave a drain in place.   Then, I closed the trocar sites. I placed 0 Vicryl sutures at the 15-mm port site in the right upper quadrant. The other port sites seemed smaller not requiring sutures. I closed the skin at each site with a 4-0 Monocryl, painted each wound with LiquiBand.   The patient was transported to recovery room in good condition. Sponge and needle count were correct at the end of the case.    Ovidio Kin, MD, [facility] Pager: [phone] Office phone:  [phone]

## 2018-08-30 NOTE — Progress Notes (Signed)
Pt was sleeping so Ice and water just started.

## 2018-08-30 NOTE — Progress Notes (Signed)
Discussed post op day goals with patient including ambulation, IS, diet progression, pain, and nausea control.  Questions answered. 

## 2018-08-30 NOTE — Interval H&P Note (Signed)
For gastric sleeve. Husband, daughter, and son at bedside.

## 2018-08-30 NOTE — Progress Notes (Signed)
PHARMACY CONSULT FOR:  Risk Assessment for Post-Discharge VTE Following Bariatric Surgery  Post-Discharge VTE Risk Assessment: This patient's probability of 30-day post-discharge VTE is increased due to the factors marked:   Female    Age >/=60 years  x  BMI >/=50 kg/m2    CHF    Dyspnea at Rest    Paraplegia   x Non-gastric-band surgery    Operation Time >/=3 hr    Return to OR     Length of Stay >/= 3 d   Predicted probability of 30-day post-discharge VTE: 0.27 %  Recommendation for Discharge: No pharmacologic prophylaxis post-discharge   [name] is a 57 y.o. female who underwent  Sleeve gastrectomy on 08/30/18.   Case start: 0751 Case end: 0935   Allergies  Allergen Reactions  . Aspirin Itching and Swelling  . Demerol [Meperidine] Nausea And Vomiting  . Nsaids Itching and Swelling  . Other Nausea Only    PERFUMES CAUSE HEADACHE  . Salicylates Itching and Swelling  . Dilaudid [Hydromorphone Hcl] Itching  . Iohexol Itching         Patient Measurements: Height: 5' 1.5" (156.2 cm) Weight: (!) 303 lb 4 oz (137.6 kg) IBW/kg (Calculated) : 48.95 Body mass index is 56.37 kg/m.  Recent Labs    08/29/18 1009  WBC 7.0  HGB 13.5  HCT 40.9  PLT 303  CREATININE 0.62  ALBUMIN 4.4  PROT 7.4  AST 25  ALT 17  ALKPHOS 60  BILITOT 0.8   Estimated Creatinine Clearance: 100.9 mL/min (by C-G formula based on SCr of 0.62 mg/dL).   Cindi Carbon, PharmD 08/30/2018,12:54 PM

## 2018-08-30 NOTE — Transfer of Care (Signed)
Immediate Anesthesia Transfer of Care Note  Patient: [name]  Procedure(s) Performed: LAPAROSCOPIC GASTRIC SLEEVE RESECTION, Upper Endo, ERAS Pathway (N/A Abdomen)  Patient Location: PACU  Anesthesia Type:General  Level of Consciousness: awake, alert  and oriented  Airway & Oxygen Therapy: Patient Spontanous Breathing and Patient connected to face mask oxygen  Post-op Assessment: Report given to RN and Post -op Vital signs reviewed and stable  Post vital signs: Reviewed and stable  Last Vitals:  Vitals Value Taken Time  BP    Temp    Pulse 67 08/30/2018  9:49 AM  Resp 17 08/30/2018  9:49 AM  SpO2 98 % 08/30/2018  9:49 AM  Vitals shown include unvalidated device data.  Last Pain:  Vitals:   08/30/18 0625  TempSrc:   PainSc: 0-No pain         Complications: No apparent anesthesia complications

## 2018-08-30 NOTE — Anesthesia Procedure Notes (Signed)
Procedure Name: Intubation Date/Time: 08/30/2018 7:30 AM Performed by: Jhonnie Garner, CRNA Pre-anesthesia Checklist: Patient identified, Emergency Drugs available, Suction available and Patient being monitored Patient Re-evaluated:Patient Re-evaluated prior to induction Oxygen Delivery Method: Circle system utilized Preoxygenation: Pre-oxygenation with 100% oxygen Induction Type: IV induction Laryngoscope Size: Glidescope (LoPro 4) Grade View: Grade I Tube type: Oral Tube size: 7.5 mm Number of attempts: 1 Airway Equipment and Method: Patient positioned with wedge pillow,  Stylet and Video-laryngoscopy Placement Confirmation: ETT inserted through vocal cords under direct vision,  positive ETCO2 and breath sounds checked- equal and bilateral Secured at: 22 cm Tube secured with: Tape Dental Injury: Teeth and Oropharynx as per pre-operative assessment

## 2018-08-30 NOTE — Op Note (Signed)
[name] [id number] 24-Apr-1961 08/30/2018  Preoperative diagnosis: sleeve gastrectomy in progress  Postoperative diagnosis: Same   Procedure: Upper endoscopy   Surgeon: Susy Frizzle B. Daphine Deutscher  M.D., FACS   Anesthesia: Gen.   Indications for procedure: This patient was undergoing a sleeve gastrectomy by Dr. Ezzard Standing.    Description of procedure: The endoscopy was placed in the mouth and into the oropharynx and under endoscopic vision it was advanced to the esophagogastric junction.  The EG junction was at 40 cm and no hiatal hernia was seen.  The sleeve appeared cylindrical as I passed the scope to the pylous.   No bleeding or leaks were detected.  The scope was withdrawn as I decompressed without difficulty.     Matt B. Daphine Deutscher, MD, FACS General, Bariatric, & Minimally Invasive Surgery [facility], [place]

## 2018-08-30 NOTE — Discharge Instructions (Signed)
° ° ° °GASTRIC BYPASS/SLEEVE ° Home Care Instructions ° ° These instructions are to help you care for yourself when you go home. ° °Call: If you have any problems. °• Call [phone] and ask for the surgeon on call °• If you need immediate help, come to the ER at [place].  °• Tell the ER staff that you are a new post-op gastric bypass or gastric sleeve patient °  °Signs and symptoms to report: • Severe vomiting or nausea °o If you cannot keep down clear liquids for longer than 1 day, call your surgeon  °• Abdominal pain that does not get better after taking your pain medication °• Fever over 100.4° F with chills °• Heart beating over 100 beats a minute °• Shortness of breath at rest °• Chest pain °•  Redness, swelling, drainage, or foul odor at incision (surgical) sites °•  If your incisions open or pull apart °• Swelling or pain in calf (lower leg) °• Diarrhea (Loose bowel movements that happen often), frequent watery, uncontrolled bowel movements °• Constipation, (no bowel movements for 3 days) if this happens: Pick one °o Milk of Magnesia, 2 tablespoons by mouth, 3 times a day for 2 days if needed °o Stop taking Milk of Magnesia once you have a bowel movement °o Call your doctor if constipation continues °Or °o Miralax  (instead of Milk of Magnesia) following the label instructions °o Stop taking Miralax once you have a bowel movement °o Call your doctor if constipation continues °• Anything you think is not normal °  °Normal side effects after surgery: • Unable to sleep at night or unable to focus °• Irritability or moody °• Being tearful (crying) or depressed °These are common complaints, possibly related to your anesthesia medications that put you to sleep, stress of surgery, and change in lifestyle.  This usually goes away a few weeks after surgery.  If these feelings continue, call your primary care doctor. °  °Wound Care: You may have surgical glue, steri-strips, or staples over your incisions after  surgery °• Surgical glue:  Looks like a clear film over your incisions and will wear off a little at a time °• Steri-strips: Strips of tape over your incisions. You may notice a yellowish color on the skin under the steri-strips. This is used to make the   steri-strips stick better. Do not pull the steri-strips off - let them fall off °• Staples: Staples may be removed before you leave the hospital °o If you go home with staples, call [facility], [phone] at for an appointment with your surgeon’s nurse to have staples removed 10 days after surgery. °• Showering: You may shower two (2) days after your surgery unless your surgeon tells you differently °o Wash gently around incisions with warm soapy water, rinse well, and gently pat dry  °o No tub baths until staples are removed, steri-strips fall off or glue is gone.  °  °Medications: • Medications should be liquid or crushed if larger than the size of a dime °• Extended release pills (medication that release a little bit at a time through the day) should NOT be crushed or cut. (examples include XL, ER, DR, SR) °• Depending on the size and number of medications you take, you may need to space (take a few throughout the day)/change the time you take your medications so that you do not over-fill your pouch (smaller stomach) °• Make sure you follow-up with your primary care doctor to   make medication changes needed during rapid weight loss and life-style changes °• If you have diabetes, follow up with the doctor that orders your diabetes medication(s) within one week after surgery and check your blood sugar regularly. °• Do not drive while taking prescription pain medication  °• It is ok to take Tylenol by the bottle instructions with your pain medicine or instead of your pain medicine as needed.  DO NOT TAKE NSAIDS (EXAMPLES OF NSAIDS:  IBUPROFREN/ NAPROXEN)  °Diet:                    First 2 Weeks ° You will see the dietician t about two (2) weeks  after your surgery. The dietician will increase the types of foods you can eat if you are handling liquids well: °• If you have severe vomiting or nausea and cannot keep down clear liquids lasting longer than 1 day, call your surgeon @ ([phone]) °Protein Shake °• Drink at least 2 ounces of shake 5-6 times per day °• Each serving of protein shakes (usually 8 - 12 ounces) should have: °o 15 grams of protein  °o And no more than 5 grams of carbohydrate  °• Goal for protein each day: °o Men = 80 grams per day °o Women = 60 grams per day °• Protein powder may be added to fluids such as non-fat milk or Lactaid milk or unsweetened Soy/Almond milk (limit to 35 grams added protein powder per serving) ° °Hydration °• Slowly increase the amount of water and other clear liquids as tolerated (See Acceptable Fluids) °• Slowly increase the amount of protein shake as tolerated  °•  Sip fluids slowly and throughout the day.  Do not use straws. °• May use sugar substitutes in small amounts (no more than 6 - 8 packets per day; i.e. Splenda) ° °Fluid Goal °• The first goal is to drink at least 8 ounces of protein shake/drink per day (or as directed by the nutritionist); some examples of protein shakes are Syntrax Nectar, Adkins Advantage, EAS Edge HP, and Unjury. See handout from pre-op Bariatric Education Class: °o Slowly increase the amount of protein shake you drink as tolerated °o You may find it easier to slowly sip shakes throughout the day °o It is important to get your proteins in first °• Your fluid goal is to drink 64 - 100 ounces of fluid daily °o It may take a few weeks to build up to this °• 32 oz (or more) should be clear liquids  °And  °• 32 oz (or more) should be full liquids (see below for examples) °• Liquids should not contain sugar, caffeine, or carbonation ° °Clear Liquids: °• Water or Sugar-free flavored water (i.e. Fruit H2O, Propel) °• Decaffeinated coffee or tea (sugar-free) °• Crystal Lite, Wyler’s Lite,  Minute Maid Lite °• Sugar-free Jell-O °• Bouillon or broth °• Sugar-free Popsicle:   *Less than 20 calories each; Limit 1 per day ° °Full Liquids: °Protein Shakes/Drinks + 2 choices per day of other full liquids °• Full liquids must be: °o No More Than 15 grams of Carbs per serving  °o No More Than 3 grams of Fat per serving °• Strained low-fat cream soup (except Cream of Potato or Tomato) °• Non-Fat milk °• Fat-free Lactaid Milk °• Unsweetened Soy Or Unsweetened Almond Milk °• Low Sugar yogurt (Dannon Lite & Fit, Greek yogurt; Oikos Triple Zero; Chobani Simply 100; Yoplait 100 calorie Greek - No Fruit on the Bottom) ° °  °Vitamins   and Minerals • Start 1 day after surgery unless otherwise directed by your surgeon °• 2 Chewable Bariatric Specific Multivitamin / Multimineral Supplement with iron (Example: Bariatric Advantage Multi EA) °• Chewable Calcium with Vitamin D-3 °(Example: 3 Chewable Calcium Plus 600 with Vitamin D-3) °o Take 500 mg three (3) times a day for a total of 1500 mg each day °o Do not take all 3 doses of calcium at one time as it may cause constipation, and you can only absorb 500 mg  at a time  °o Do not mix multivitamins containing iron with calcium supplements; take 2 hours apart °• Menstruating women and those with a history of anemia (a blood disease that causes weakness) may need extra iron °o Talk with your doctor to see if you need more iron °• Do not stop taking or change any vitamins or minerals until you talk to your dietitian or surgeon °• Your Dietitian and/or surgeon must approve all vitamin and mineral supplements °  °Activity and Exercise: Limit your physical activity as instructed by your doctor.  It is important to continue walking at home.  During this time, use these guidelines: °• Do not lift anything greater than ten (10) pounds for at least two (2) weeks °• Do not go back to work or drive until your surgeon says you can °• You may have sex when you feel comfortable  °o It is  VERY important for female patients to use a reliable birth control method; fertility often increases after surgery  °o All hormonal birth control will be ineffective for 30 days after surgery due to medications given during surgery a barrier method must be used. °o Do not get pregnant for at least 18 months °• Start exercising as soon as your doctor tells you that you can °o Make sure your doctor approves any physical activity °• Start with a simple walking program °• Walk 5-15 minutes each day, 7 days per week.  °• Slowly increase until you are walking 30-45 minutes per day °Consider joining our BELT program. [phone] or email [email or web address] °  °Special Instructions Things to remember: °• Use your CPAP when sleeping if this applies to you ° °• [facility] has two free Bariatric Surgery Support Groups that meet monthly °o The 3rd Thursday of each month, 6 pm, [facility]  °o The 2nd Friday of each month, 11:45 am in the private dining room in the basement of [place] °• It is very important to keep all follow up appointments with your surgeon, dietitian, primary care physician, and behavioral health practitioner °• Routine follow up schedule with your surgeon include appointments at 2-3 weeks, 6-8 weeks, 6 months, and 1 year at a minimum.  Your surgeon may request to see you more often.   °o After the first year, please follow up with your bariatric surgeon and dietitian at least once a year in order to maintain best weight loss results °[facility]: [phone] °[facility]: [phone] °Bariatric Nurse Coordinator: [phone] °  °   Reviewed and Endorsed  °by [facility] Patient Education Committee, June, 2016 °Edits Approved: Aug, 2018 ° ° ° °

## 2018-08-30 NOTE — Anesthesia Preprocedure Evaluation (Signed)
Anesthesia Evaluation  Patient identified by MRN, date of birth, ID band Patient awake    Reviewed: Allergy & Precautions, NPO status , Patient's Chart, lab work & pertinent test results  History of Anesthesia Complications (+) PONV, DIFFICULT AIRWAY and history of anesthetic complications  Airway Mallampati: II  TM Distance: <3 FB Neck ROM: Full    Dental  (+) Teeth Intact   Pulmonary sleep apnea and Continuous Positive Airway Pressure Ventilation ,    breath sounds clear to auscultation       Cardiovascular hypertension, Pt. on medications (-) angina(-) Past MI and (-) CHF  Rhythm:Regular     Neuro/Psych PSYCHIATRIC DISORDERS Depression  Neuromuscular disease    GI/Hepatic Neg liver ROS, GERD  Medicated and Controlled,  Endo/Other  Morbid obesity  Renal/GU negative Renal ROS     Musculoskeletal  (+) Arthritis ,   Abdominal   Peds  Hematology   Anesthesia Other Findings   Reproductive/Obstetrics                             Anesthesia Physical Anesthesia Plan  ASA: III  Anesthesia Plan: General   Post-op Pain Management:    Induction: Intravenous  PONV Risk Score and Plan: 4 or greater and Ondansetron, Dexamethasone, Propofol infusion, TIVA, Midazolam and Scopolamine patch - Pre-op  Airway Management Planned: Oral ETT  Additional Equipment: None  Intra-op Plan:   Post-operative Plan: Extubation in OR  Informed Consent: I have reviewed the patients History and Physical, chart, labs and discussed the procedure including the risks, benefits and alternatives for the proposed anesthesia with the patient or authorized representative who has indicated his/her understanding and acceptance.   Dental advisory given  Plan Discussed with: CRNA and Surgeon  Anesthesia Plan Comments:         Anesthesia Quick Evaluation

## 2018-08-31 LAB — CBC WITH DIFFERENTIAL/PLATELET
Basophils Absolute: 0 10*3/uL (ref 0.0–0.1)
Basophils Relative: 0 %
EOS ABS: 0 10*3/uL (ref 0.0–0.7)
Eosinophils Relative: 0 %
HEMATOCRIT: 41.8 % (ref 36.0–46.0)
HEMOGLOBIN: 13.5 g/dL (ref 12.0–15.0)
LYMPHS ABS: 0.8 10*3/uL (ref 0.7–4.0)
Lymphocytes Relative: 8 %
MCH: 28.1 pg (ref 26.0–34.0)
MCHC: 32.3 g/dL (ref 30.0–36.0)
MCV: 87.1 fL (ref 78.0–100.0)
MONOS PCT: 6 %
Monocytes Absolute: 0.6 10*3/uL (ref 0.1–1.0)
NEUTROS ABS: 8.6 10*3/uL — AB (ref 1.7–7.7)
NEUTROS PCT: 86 %
Platelets: 298 10*3/uL (ref 150–400)
RBC: 4.8 MIL/uL (ref 3.87–5.11)
RDW: 14.1 % (ref 11.5–15.5)
WBC: 10 10*3/uL (ref 4.0–10.5)

## 2018-08-31 NOTE — Discharge Summary (Signed)
Physician Discharge Summary  Patient ID:  [name]  MRN: [id number]  DOB/AGE: [birth year] 59 y.o.  Admit date: 08/30/2018 Discharge date: 08/31/2018  Discharge Diagnoses:  1.  OBESITY, MORBID, BMI 50 OR HIGHER (E66.01) 2.  HYPERTENSION, BENIGN (I10)             x 6 years 3.  GOUT (M10.9)             - left big toe 4.  DIFFICULT AIRWAY FOR INTUBATION (T88.4XXA)  5. She saw Dr. Anne Fu several years ago for cardiology, but is not followed now. I could not find a note in Epic. 6. Pre diabetic. She takes metformin 7. GERD, mild.  Takes omeparazole. 8. Knee problems, right worse tha left Sees Dr. Turner Daniels   Active Problems:   Morbid obesity with BMI of 50.0-59.9, adult (HCC)  Operation: Procedure(s): LAPAROSCOPIC GASTRIC SLEEVE RESECTION, Upper Endo on 08/30/2018 - D. Ezzard Standing  Discharged Condition: good  Hospital Course: [name] is an 57 y.o. female whose primary care physician is Juluis Rainier, MD and who was admitted 08/30/2018 with a chief complaint of morbid obesity.   She was brought to the operating room on 08/30/2018 and underwent  LAPAROSCOPIC GASTRIC SLEEVE RESECTION, Upper Endo. She is now one day post op.  She is doing well with her po intake.  She is to start on her protein drinks. She is ready to go home.  She plans to go home later today after taking some of the protein drinks.   The discharge instructions were reviewed with the patient.  Consults: None  Significant Diagnostic Studies: Results for orders placed or performed during the hospital encounter of 08/30/18  Pregnancy, urine STAT morning of surgery  Result Value Ref Range   Preg Test, Ur NEGATIVE NEGATIVE  Hemoglobin and hematocrit, blood  Result Value Ref Range   Hemoglobin 14.3 12.0 - 15.0 g/dL   HCT 40.9 81.1 - 91.4 %  CBC WITH DIFFERENTIAL  Result Value Ref Range   WBC 10.0 4.0 - 10.5 K/uL   RBC 4.80 3.87 - 5.11 MIL/uL   Hemoglobin 13.5 12.0 - 15.0 g/dL   HCT 78.2 95.6 -  21.3 %   MCV 87.1 78.0 - 100.0 fL   MCH 28.1 26.0 - 34.0 pg   MCHC 32.3 30.0 - 36.0 g/dL   RDW 08.6 57.8 - 46.9 %   Platelets 298 150 - 400 K/uL   Neutrophils Relative % 86 %   Neutro Abs 8.6 (H) 1.7 - 7.7 K/uL   Lymphocytes Relative 8 %   Lymphs Abs 0.8 0.7 - 4.0 K/uL   Monocytes Relative 6 %   Monocytes Absolute 0.6 0.1 - 1.0 K/uL   Eosinophils Relative 0 %   Eosinophils Absolute 0.0 0.0 - 0.7 K/uL   Basophils Relative 0 %   Basophils Absolute 0.0 0.0 - 0.1 K/uL    No results found.  Discharge Exam:  Vitals:   08/30/18 2147 08/31/18 0537  BP: (!) 148/86 118/74  Pulse: 63 (!) 54  Resp: 16 20  Temp: 97.8 F (36.6 C) 98.1 F (36.7 C)  SpO2: 97% 93%    General: Obese WF who is alert and generally healthy appearing.  Lungs: Clear to auscultation and symmetric breath sounds. Heart:  RRR. No murmur or rub. Abdomen: Very large abdomen. No mass. Few bowel sounds.        Incisions look good.  Discharge Medications:   Allergies as of 08/31/2018  Reactions   Aspirin Itching, Swelling   Demerol [meperidine] Nausea And Vomiting   Nsaids Itching, Swelling   Other Nausea Only   PERFUMES CAUSE HEADACHE   Salicylates Itching, Swelling   Dilaudid [hydromorphone Hcl] Itching   Iohexol Itching         Medication List    TAKE these medications   allopurinol 100 MG tablet Commonly known as:  ZYLOPRIM Take 100 mg by mouth daily.   clindamycin 1 % gel Commonly known as:  CLINDAGEL Apply 1 application topically daily. Hidradenitis suppurativa   clindamycin 1 % external solution Commonly known as:  CLEOCIN T Apply 1 application topically daily as needed. Hidradenitis suppurativa   cyclobenzaprine 10 MG tablet Commonly known as:  FLEXERIL Take 10 mg by mouth daily as needed for muscle spasms.   doxycycline 100 MG capsule Commonly known as:  VIBRAMYCIN Take 100 mg by mouth daily. with food   DULoxetine 30 MG capsule Commonly known as:  CYMBALTA Take 30 mg by  mouth every evening.   furosemide 20 MG tablet Commonly known as:  LASIX Take 20 mg by mouth daily as needed for fluid. Notes to patient:  Monitor Blood Pressure Daily and keep a log for primary care physician.  Monitor for symptoms of dehydration.  You may need to make changes to your medications with rapid weight loss.     lisinopril 20 MG tablet Commonly known as:  PRINIVIL,ZESTRIL Take 20 mg by mouth daily. Notes to patient:  Monitor Blood Pressure Daily and keep a log for primary care physician.  You may need to make changes to your medications with rapid weight loss.     multivitamin with minerals Tabs tablet Take 1 tablet by mouth daily.   omeprazole 20 MG capsule Commonly known as:  PRILOSEC Take 20 mg by mouth daily.   pantoprazole 20 MG tablet Commonly known as:  PROTONIX Take 20 mg by mouth daily.   TYLENOL 8 HOUR ARTHRITIS PAIN 650 MG CR tablet Generic drug:  acetaminophen Take 650-1,300 mg by mouth every 8 (eight) hours as needed for pain.   Vitamin D-3 5000 units Tabs Take 5,000 Units by mouth daily.       Disposition: Discharge disposition: 01-Home or Self Care       Discharge Instructions    Ambulate hourly while awake   Complete by:  As directed    Call MD for:  difficulty breathing, headache or visual disturbances   Complete by:  As directed    Call MD for:  persistant dizziness or light-headedness   Complete by:  As directed    Call MD for:  persistant nausea and vomiting   Complete by:  As directed    Call MD for:  redness, tenderness, or signs of infection (pain, swelling, redness, odor or green/yellow discharge around incision site)   Complete by:  As directed    Call MD for:  severe uncontrolled pain   Complete by:  As directed    Call MD for:  temperature >101 F   Complete by:  As directed    Diet bariatric full liquid   Complete by:  As directed    Incentive spirometry   Complete by:  As directed    Perform hourly while awake       Follow-up Information    Ovidio Kin, MD. Go on 09/23/2018.   Specialty:  General Surgery Why:  at [address] information: [address] [place] [address] [phone]  Hedda Slade, PA-C .   Specialty:  General Surgery Contact information: [address] [place] [address] [place] [address] [phone]            Signed: Ovidio Kin, M.D., [facility] Office:  [phone]  08/31/2018, 7:43 AM

## 2018-08-31 NOTE — Progress Notes (Signed)

## 2018-08-31 NOTE — Plan of Care (Signed)

## 2018-08-31 NOTE — Progress Notes (Signed)
Pt started first 2oz cup of protein at 0941.

## 2018-08-31 NOTE — Progress Notes (Signed)
Discharge instructions given to pt and all questions were answered. Pt taken down via wheelchair and was picked up by her husband.  

## 2018-09-01 ENCOUNTER — Encounter (HOSPITAL_COMMUNITY): Payer: Self-pay | Admitting: Surgery

## 2018-09-01 NOTE — Anesthesia Postprocedure Evaluation (Signed)
Anesthesia Post Note  Patient: [name]  Procedure(s) Performed: LAPAROSCOPIC GASTRIC SLEEVE RESECTION, Upper Endo, ERAS Pathway (N/A Abdomen)     Patient location during evaluation: PACU Anesthesia Type: General Level of consciousness: awake and alert Pain management: pain level controlled Vital Signs Assessment: post-procedure vital signs reviewed and stable Respiratory status: spontaneous breathing, nonlabored ventilation, respiratory function stable and patient connected to nasal cannula oxygen Cardiovascular status: blood pressure returned to baseline and stable Postop Assessment: no apparent nausea or vomiting Anesthetic complications: no    Last Vitals:  Vitals:   08/31/18 0918 08/31/18 1229  BP: 121/72   Pulse: (!) 55   Resp: 18   Temp: 36.8 C   SpO2: 98% 96%    Last Pain:  Vitals:   08/31/18 0918  TempSrc: Oral  PainSc:                  Shia Eber

## 2018-09-05 ENCOUNTER — Telehealth (HOSPITAL_COMMUNITY): Payer: Self-pay

## 2018-09-05 NOTE — Telephone Encounter (Signed)
Patient called to discuss post bariatric surgery follow up questions.  See below:   1.  Tell me about your pain and pain management?no pain  2.  Let's talk about fluid intake.  How much total fluid are you taking in? 31-33  3.  How much protein have you taken in the last 2 days?30-45  4.  Have you had nausea?  Tell me about when have experienced nausea and what you did to help?nausea no vomitting, taken 3 nausea pills  5.  Has the frequency or color changed with your urine?straw colored urine frequent  6.  Tell me what your incisions look like?look good  7.  Have you been passing gas? BM?had bm, + gas  8.  If a problem or question were to arise who would you call?  Do you know contact numbers for [facility], [facility], and [facility]?aware of how to contact all services  9.  How has the walking going?walking frequently   10.  How are your vitamins and calcium going?  How are you taking them?mvi started x2 per day, calcium

## 2018-09-06 ENCOUNTER — Telehealth (HOSPITAL_COMMUNITY): Payer: Self-pay

## 2018-09-06 NOTE — Telephone Encounter (Signed)
Call  for fluid intake recall for last 24 hours.  Patient increased fluid from 31-35 ounces to 48 ounces.  Reports urine lighter in color.  No s/s of dehydration. Feels much better and states  "I made a turn".  Follow up with PCP today at 12 pm.  Instructed to call should issues arise. Reminded of [facility] appointment next Tuesday at 330pm.  Appointment with [facility] provided to patient for follow up.

## 2018-09-13 ENCOUNTER — Encounter: Payer: BLUE CROSS/BLUE SHIELD | Attending: Surgery | Admitting: Skilled Nursing Facility1

## 2018-09-13 DIAGNOSIS — Z713 Dietary counseling and surveillance: Secondary | ICD-10-CM | POA: Insufficient documentation

## 2018-09-13 DIAGNOSIS — Z6841 Body Mass Index (BMI) 40.0 and over, adult: Secondary | ICD-10-CM

## 2018-09-14 NOTE — Progress Notes (Signed)
Bariatric Class:  Appt start time: 1530 end time:  1630.  2 Week Post-Operative Nutrition Class  Patient was seen on 09/13/2018 for Post-Operative Nutrition education at [facility].   Surgery date: 08/30/2018 Surgery type: Sleeve Start weight at [facility]: 328.6 Weight today: 288.2  TANITA  BODY COMP RESULTS  09/13/2018   BMI (kg/m^2) 54.5   Fat Mass (lbs) 151   Fat Free Mass (lbs) 137.2   Total Body Water (lbs) 101.2   The following the learning objectives were met by the patient during this course:  Identifies Phase 3A (Soft, High Proteins) Dietary Goals and will begin from 2 weeks post-operatively to 2 months post-operatively  Identifies appropriate sources of fluids and proteins   States protein recommendations and appropriate sources post-operatively  Identifies the need for appropriate texture modifications, mastication, and bite sizes when consuming solids  Identifies appropriate multivitamin and calcium sources post-operatively  Describes the need for physical activity post-operatively and will follow MD recommendations  States when to call healthcare provider regarding medication questions or post-operative complications  Handouts given during class include:  Phase 3A: Soft, High Protein Diet Handout  Follow-Up Plan: Patient will follow-up at [facility] in 6 weeks for 2 month post-op nutrition visit for diet advancement per MD.

## 2018-09-15 ENCOUNTER — Encounter: Payer: Self-pay | Admitting: Skilled Nursing Facility1

## 2018-09-19 ENCOUNTER — Telehealth: Payer: Self-pay | Admitting: Skilled Nursing Facility1

## 2018-09-19 NOTE — Telephone Encounter (Signed)
Dietitian called pt to assess pts diet progression from Liquid to Solid phase.  Fluid: 39  Oz including soup and protein shake)  Protein: 45 grams  Pt states she is struggling with constipation states she has had 4 enemas which then caused diarrhea. Pt states she has been struggling with bad nausea.  Pt states she had tomato soup and pinto beans which went well. Pt states every time she eats she gets nausea. Pt states she has been taking protonix. Pt states water and other fluids just gurgle back up and she cannot drink. Pt states all the sugar free options are disgusting. Pt states she feels she needed liquid for a little bit and may can tolerate solids again. Pt states she starts out enjoying food but then feels nauseated by the end. Pt states she thinks it is chewing until applesauce making her feel nauseous.   Dietitian congratulated her on trying different flavors and textures. Dietitian advised she try kefir and warm liquids and 1 cup of caffeinated coffee. When feeling nauseous stop and try again 1 hour later.  Pt states she can try tea with 1 tsp of honey.   Dietitian advised pt to call [facility] Monday if she was continuing to struggle.

## 2018-10-04 ENCOUNTER — Telehealth: Payer: Self-pay | Admitting: Registered"

## 2018-10-04 NOTE — Telephone Encounter (Signed)
RD returned pt's phone call. Pt unavailable; left voicemail.

## 2018-10-24 ENCOUNTER — Encounter: Payer: BLUE CROSS/BLUE SHIELD | Attending: Surgery | Admitting: Skilled Nursing Facility1

## 2018-10-24 ENCOUNTER — Encounter: Payer: Self-pay | Admitting: Skilled Nursing Facility1

## 2018-10-24 DIAGNOSIS — Z6841 Body Mass Index (BMI) 40.0 and over, adult: Secondary | ICD-10-CM | POA: Diagnosis not present

## 2018-10-24 DIAGNOSIS — Z713 Dietary counseling and surveillance: Secondary | ICD-10-CM | POA: Insufficient documentation

## 2018-10-24 NOTE — Progress Notes (Signed)
Follow-up visit: 8 Weeks Post-Operative sleeve Surgery  Primary concerns today: Post-operative Bariatric Surgery Nutrition Management.  Pt arrives not wanting to do the tanita due to wearing compression hoes. Pt states she can eat deli meat warmed better than cold. Pt states she is scared because her weight loss has slowed and she is starting to feel hunger now. Pt states she weighs every day stating she knows it makes her upset to weigh every day but still struggles with not weighing every day. Pt states she has been experiencing some gout flare-ups keeping her from going to [facility] but will try to get herself there prior. Pt states the first few weeks she had some nausea but it is better now. Pt states since surgery her vertigo has increased with swimmy head trying to bend over or when laying down. Pt states dislikes calcium supplement and will try another type to meet calcium needs.   Surgery date: 08/30/2018 Surgery type: Sleeve [facility] 328.6 Weight today: 288.2 Weight change: 273.3  TANITA  BODY COMP RESULTS  09/13/2018   BMI (kg/m^2) 54.5   Fat Mass (lbs) 151   Fat Free Mass (lbs) 137.2   Total Body Water (lbs) 101.2   24-hr recall: B (6:30AM): protein shake Snk (10AM): 1 scrambled egg L (PM): 2.4 ounce pork chop or 3 slices of deli meat rolled with cheestick  Snk (PM): cheesetick or egg salad D (PM): ground Mal[place]urkey with taco seasoning  Snk (PM): sugar free pudding   Fluid intake: water, 12 ounces coffee with non dairy creamer: 67.6+ Estimated total protein intake: 60+  Medications: no longer taking lisinopril  Supplementation: multi but forgetting calcium   Using straws: no Drinking while eating: no Having you been chewing well: yes Chewing/swallowing difficulties: no Changes in vision: no Changes to mood/headaches: no Hair loss/Cahnges to skin/Changes to nails: no Any difficulty focusing or concentrating: no Sweating: no Dizziness/Lightheaded: sometimes  (possibly vertigo) Palpitations: no  Carbonated beverages: no N/V/D/C/GAS: no Abdominal Pain: no Dumping syndrome: no  Recent physical activity:  ADL's  Progress Towards Goal(s):  In progress.  Handouts given during visit include:  Non starchy veggie + protein     Intervention:  Nutrition counseling. Pts diet was advanced to the next phase now including non starchy  Due to the bodies need for essential vitamins, minerals, and fats the pt was educated on the need to consume a certain amount of calories as well as certain nutrients daily. Pt was educated on the need for daily physical activity and to reach a goal of at least 150 minutes of moderate to vigorous physical activity as directed by their physician due to such benefits as increased musculature and improved lab values.  Educated pt on how to implement change to diet (adding non-starchy vegetables).  Goals: -Try the calcium citrate again -Continue to aim for a minimum of 64 fluid ounces 7 days a week with at least 30 ounces being plain water -Eat non-starchy vegetables 2 times a day 7 days a week -Start out with soft cooked vegetables today and tomorrow; if tolerated begin to eat raw vegetables or cooked including salads -Eat your 3 ounces of protein first then start in on your non-starchy vegetables; once you understand how much of your meal leads to satisfaction and not full while still eating 3 ounces of protein and non-starchy vegetables you can eat them in any order  -Continue to aim for 30 minutes of activity at least 5 times a week -Try the  ultra solo with iron for multivitamin  Teaching Method Utilized:  Visual Auditory Hands on  Barriers to learning/adherence to lifestyle change: none identified  Demonstrated degree of understanding via:  Teach Back   Monitoring/Evaluation:  Dietary intake, exercise, and body weight. Follow up on 3/10 for 6 month post-op class.

## 2018-10-24 NOTE — Patient Instructions (Addendum)
-  Try the calcium citrate again  -Continue to aim for a minimum of 64 fluid ounces 7 days a week with at least 30 ounces being plain water  -Eat non-starchy vegetables 2 times a day 7 days a week  -Start out with soft cooked vegetables today and tomorrow; if tolerated begin to eat raw vegetables or cooked including salads  -Eat your 3 ounces of protein first then start in on your non-starchy vegetables; once you understand how much of your meal leads to satisfaction and not full while still eating 3 ounces of protein and non-starchy vegetables you can eat them in any order   -Continue to aim for 30 minutes of activity at least 5 times a week   -Try the ultra solo with iron for multivitamin

## 2018-10-26 ENCOUNTER — Ambulatory Visit: Payer: BLUE CROSS/BLUE SHIELD | Admitting: Skilled Nursing Facility1

## 2019-02-07 ENCOUNTER — Ambulatory Visit: Payer: BLUE CROSS/BLUE SHIELD | Admitting: Skilled Nursing Facility1

## 2019-05-09 ENCOUNTER — Encounter (HOSPITAL_COMMUNITY): Payer: Self-pay

## 2019-05-09 ENCOUNTER — Other Ambulatory Visit: Payer: Self-pay

## 2019-05-09 ENCOUNTER — Inpatient Hospital Stay (HOSPITAL_COMMUNITY)
Admission: EM | Admit: 2019-05-09 | Discharge: 2019-05-12 | DRG: 354 | Disposition: A | Discharge status: Home / Self Care | Payer: BC Managed Care – PPO | Attending: Surgery | Admitting: Surgery

## 2019-05-09 ENCOUNTER — Emergency Department (HOSPITAL_COMMUNITY): Payer: BC Managed Care – PPO

## 2019-05-09 DIAGNOSIS — Z4682 Encounter for fitting and adjustment of non-vascular catheter: Secondary | ICD-10-CM | POA: Diagnosis not present

## 2019-05-09 DIAGNOSIS — Z6841 Body Mass Index (BMI) 40.0 and over, adult: Secondary | ICD-10-CM | POA: Diagnosis not present

## 2019-05-09 DIAGNOSIS — T884XXA Failed or difficult intubation, initial encounter: Secondary | ICD-10-CM | POA: Diagnosis present

## 2019-05-09 DIAGNOSIS — M109 Gout, unspecified: Secondary | ICD-10-CM | POA: Diagnosis present

## 2019-05-09 DIAGNOSIS — Z886 Allergy status to analgesic agent status: Secondary | ICD-10-CM | POA: Diagnosis not present

## 2019-05-09 DIAGNOSIS — Z1159 Encounter for screening for other viral diseases: Secondary | ICD-10-CM

## 2019-05-09 DIAGNOSIS — Z9884 Bariatric surgery status: Secondary | ICD-10-CM | POA: Diagnosis not present

## 2019-05-09 DIAGNOSIS — K420 Umbilical hernia with obstruction, without gangrene: Secondary | ICD-10-CM | POA: Diagnosis not present

## 2019-05-09 DIAGNOSIS — M19011 Primary osteoarthritis, right shoulder: Secondary | ICD-10-CM | POA: Diagnosis not present

## 2019-05-09 DIAGNOSIS — K56609 Unspecified intestinal obstruction, unspecified as to partial versus complete obstruction: Secondary | ICD-10-CM

## 2019-05-09 DIAGNOSIS — G4730 Sleep apnea, unspecified: Secondary | ICD-10-CM | POA: Diagnosis not present

## 2019-05-09 DIAGNOSIS — K219 Gastro-esophageal reflux disease without esophagitis: Secondary | ICD-10-CM | POA: Diagnosis present

## 2019-05-09 DIAGNOSIS — K439 Ventral hernia without obstruction or gangrene: Secondary | ICD-10-CM

## 2019-05-09 DIAGNOSIS — K430 Incisional hernia with obstruction, without gangrene: Secondary | ICD-10-CM | POA: Diagnosis not present

## 2019-05-09 DIAGNOSIS — Z8249 Family history of ischemic heart disease and other diseases of the circulatory system: Secondary | ICD-10-CM | POA: Diagnosis not present

## 2019-05-09 DIAGNOSIS — Z87442 Personal history of urinary calculi: Secondary | ICD-10-CM

## 2019-05-09 DIAGNOSIS — M170 Bilateral primary osteoarthritis of knee: Secondary | ICD-10-CM | POA: Diagnosis not present

## 2019-05-09 DIAGNOSIS — Z888 Allergy status to other drugs, medicaments and biological substances status: Secondary | ICD-10-CM | POA: Diagnosis not present

## 2019-05-09 DIAGNOSIS — Z79899 Other long term (current) drug therapy: Secondary | ICD-10-CM | POA: Diagnosis not present

## 2019-05-09 DIAGNOSIS — Z9049 Acquired absence of other specified parts of digestive tract: Secondary | ICD-10-CM

## 2019-05-09 DIAGNOSIS — Z885 Allergy status to narcotic agent status: Secondary | ICD-10-CM | POA: Diagnosis not present

## 2019-05-09 DIAGNOSIS — E669 Obesity, unspecified: Secondary | ICD-10-CM | POA: Diagnosis present

## 2019-05-09 DIAGNOSIS — R188 Other ascites: Secondary | ICD-10-CM | POA: Diagnosis not present

## 2019-05-09 DIAGNOSIS — K760 Fatty (change of) liver, not elsewhere classified: Secondary | ICD-10-CM | POA: Diagnosis not present

## 2019-05-09 DIAGNOSIS — I10 Essential (primary) hypertension: Secondary | ICD-10-CM | POA: Diagnosis not present

## 2019-05-09 DIAGNOSIS — Z03818 Encounter for observation for suspected exposure to other biological agents ruled out: Secondary | ICD-10-CM | POA: Diagnosis not present

## 2019-05-09 DIAGNOSIS — Z90711 Acquired absence of uterus with remaining cervical stump: Secondary | ICD-10-CM

## 2019-05-09 DIAGNOSIS — L732 Hidradenitis suppurativa: Secondary | ICD-10-CM | POA: Diagnosis not present

## 2019-05-09 DIAGNOSIS — R7303 Prediabetes: Secondary | ICD-10-CM | POA: Diagnosis present

## 2019-05-09 LAB — CBC
HCT: 46.7 % — ABNORMAL HIGH (ref 36.0–46.0)
Hemoglobin: 15.3 g/dL — ABNORMAL HIGH (ref 12.0–15.0)
MCH: 28.7 pg (ref 26.0–34.0)
MCHC: 32.8 g/dL (ref 30.0–36.0)
MCV: 87.6 fL (ref 80.0–100.0)
Platelets: 314 10*3/uL (ref 150–400)
RBC: 5.33 MIL/uL — ABNORMAL HIGH (ref 3.87–5.11)
RDW: 14.8 % (ref 11.5–15.5)
WBC: 13.2 10*3/uL — ABNORMAL HIGH (ref 4.0–10.5)
nRBC: 0 % (ref 0.0–0.2)

## 2019-05-09 LAB — COMPREHENSIVE METABOLIC PANEL
ALT: 13 U/L (ref 0–44)
AST: 20 U/L (ref 15–41)
Albumin: 4.5 g/dL (ref 3.5–5.0)
Alkaline Phosphatase: 67 U/L (ref 38–126)
Anion gap: 11 (ref 5–15)
BUN: 17 mg/dL (ref 6–20)
CO2: 25 mmol/L (ref 22–32)
Calcium: 10 mg/dL (ref 8.9–10.3)
Chloride: 105 mmol/L (ref 98–111)
Creatinine, Ser: 0.54 mg/dL (ref 0.44–1.00)
GFR calc Af Amer: >60 mL/min (ref >60)
GFR calc non Af Amer: >60 mL/min (ref >60)
Glucose, Bld: 105 mg/dL — ABNORMAL HIGH (ref 70–99)
Potassium: 3.5 mmol/L (ref 3.5–5.1)
Sodium: 141 mmol/L (ref 135–145)
Total Bilirubin: 0.5 mg/dL (ref 0.3–1.2)
Total Protein: 7.8 g/dL (ref 6.5–8.1)

## 2019-05-09 LAB — URINALYSIS, ROUTINE W REFLEX MICROSCOPIC
Bacteria, UA: NONE SEEN
Bilirubin Urine: NEGATIVE
Glucose, UA: NEGATIVE mg/dL
Hgb urine dipstick: NEGATIVE
Ketones, ur: 80 mg/dL — AB
Nitrite: NEGATIVE
Protein, ur: NEGATIVE mg/dL
Specific Gravity, Urine: 1.026 (ref 1.005–1.030)
pH: 7 (ref 5.0–8.0)

## 2019-05-09 LAB — LIPASE, BLOOD: Lipase: 25 U/L (ref 11–51)

## 2019-05-09 LAB — LACTIC ACID, PLASMA: Lactic Acid, Venous: 0.8 mmol/L (ref 0.5–1.9)

## 2019-05-09 MED ORDER — SODIUM CHLORIDE (PF) 0.9 % IJ SOLN
INTRAMUSCULAR | Status: AC
  Filled 2019-05-09: qty 50

## 2019-05-09 MED ORDER — ONDANSETRON HCL 4 MG/2ML IJ SOLN
4.0000 mg | Freq: Once | INTRAMUSCULAR | Status: AC
  Administered 2019-05-09: 4 mg via INTRAVENOUS
  Filled 2019-05-09: qty 2

## 2019-05-09 MED ORDER — SODIUM CHLORIDE 0.9 % IV SOLN
1000.0000 mL | INTRAVENOUS | Status: DC
  Administered 2019-05-09: 1000 mL via INTRAVENOUS

## 2019-05-09 MED ORDER — DIPHENHYDRAMINE HCL 50 MG/ML IJ SOLN
25.0000 mg | Freq: Once | INTRAMUSCULAR | Status: AC
  Administered 2019-05-09: 25 mg via INTRAVENOUS
  Filled 2019-05-09: qty 1

## 2019-05-09 MED ORDER — MORPHINE SULFATE (PF) 4 MG/ML IV SOLN
4.0000 mg | Freq: Once | INTRAVENOUS | Status: AC
  Administered 2019-05-09: 4 mg via INTRAVENOUS
  Filled 2019-05-09: qty 1

## 2019-05-09 MED ORDER — MORPHINE SULFATE (PF) 4 MG/ML IV SOLN
4.0000 mg | Freq: Once | INTRAVENOUS | Status: AC
  Administered 2019-05-09: 20:00:00 4 mg via INTRAVENOUS
  Filled 2019-05-09: qty 1

## 2019-05-09 MED ORDER — SODIUM CHLORIDE 0.9 % IV BOLUS (SEPSIS)
1000.0000 mL | Freq: Once | INTRAVENOUS | Status: AC
  Administered 2019-05-09: 1000 mL via INTRAVENOUS

## 2019-05-09 MED ORDER — PIPERACILLIN-TAZOBACTAM 3.375 G IVPB 30 MIN
3.3750 g | Freq: Once | INTRAVENOUS | Status: AC
  Administered 2019-05-09: 3.375 g via INTRAVENOUS
  Filled 2019-05-09: qty 50

## 2019-05-09 MED ORDER — SODIUM CHLORIDE 0.9% FLUSH: 3.0000 mL | Freq: Once | INTRAVENOUS | Status: DC

## 2019-05-09 MED ORDER — IOHEXOL 300 MG/ML  SOLN
100.0000 mL | Freq: Once | INTRAMUSCULAR | Status: AC | PRN
  Administered 2019-05-09: 100 mL via INTRAVENOUS

## 2019-05-09 NOTE — ED Provider Notes (Addendum)
[facility]-EMERGENCY DEPT Provider Note   CSN: [id number] Arrival date & time: 05/09/19  1602    History   Chief Complaint Chief Complaint  Patient presents with  . Abdominal Pain  . Nausea    HPI [name] is a 58 y.o. female.     HPI Patient reports that 2 weeks ago she got a fairly sudden onset of abdominal pain.  She had awakened on Sunday morning felt all right and then pretty quickly got some bad burning cramping central abdominal discomfort.  It spontaneously resolved after several hours.  She then went the ensuing 2 weeks without having any problems.  She reports today several hours prior to arrival it came back again.  Reports now it is been severe aching cramping spasmodic burning pain.  Reports she does have a known umbilical hernia that has not been repaired.  She is not sure if that is what the problem is.  She reports she is been nauseated but not vomited. no Diarrhea.  She has had prior cholecystectomy and hysterectomy. Past Medical History:  Diagnosis Date  . Arthritis    knees, right shoulder  . Carpal tunnel syndrome of left wrist 04/2013  . Dental crowns present   . Depression   . Difficult intubation   . Esophageal stricture    hx. of esophageal dilation x 1  . GERD (gastroesophageal reflux disease)   . Gout   . History of difficult intubation hysterectomy   letter from 12-12-2008 on chart, 2 more surgeries since then no intubation problems  . History of kidney stones    x 2 passed on own  . Hypertension    under control with med., has been on med. x 5 yr.  . Muscle pain   . PONV (postoperative nausea and vomiting)    nausea  . Pre-diabetes    pt does not check cbg at home  . Sleep apnea    uses CPAP nightly set on 12.0  . Umbilical hernia     Patient Active Problem List   Diagnosis Date Noted  . Morbid obesity with BMI of 50.0-59.9, adult (HCC) 08/30/2018  . Hidradenitis axillaris 09/18/2013    Past Surgical History:   Procedure Laterality Date  . BILATERAL SALPINGOOPHORECTOMY  12/11/2008  . CARPAL TUNNEL RELEASE Left 05/11/2013   Procedure: CARPAL TUNNEL RELEASE;  Surgeon: Wyn Forsterobert V Sypher Jr., MD;  Location: [facility];  Service: Orthopedics;  Laterality: Left;  . CHOLECYSTECTOMY  1985   open  . ESOPHAGEAL DILATION    . EXCISION MORTON'S NEUROMA Right 05/21/2014   Procedure: EXCISION MORTON'S NEUROMA;  Surgeon: Larey DresserMartha Ajlouny, DPM;  [facility] [place];  Service: Podiatry;  Laterality: Right;  neurectomy  . HEEL SPUR RESECTION Right 05/21/2014   Procedure: HEEL SPUR RESECTION;  Surgeon: Larey DresserMartha Ajlouny, DPM;  [facility] [place];  Service: Podiatry;  Laterality: Right;  mid-foot bone resection   . HYSTEROSCOPY W/D&C  01/28/2001  . INCISION AND DRAINAGE PERIRECTAL ABSCESS  07/03/2011  . LAPAROSCOPIC GASTRIC SLEEVE RESECTION N/A 08/30/2018   Procedure: LAPAROSCOPIC GASTRIC SLEEVE RESECTION, Upper Endo, ERAS Pathway;  Surgeon: Ovidio KinNewman, David, MD;  Location: [facility];  Service: General;  Laterality: N/A;  . LAPAROSCOPIC SUPRACERVICAL HYSTERECTOMY  12/11/2008  . TONSILLECTOMY AND ADENOIDECTOMY  age 58     OB History   No obstetric history on file.      Home Medications    Prior to Admission medications   Medication Sig Start  Date End Date Taking? Authorizing Provider  acetaminophen (TYLENOL) 500 MG tablet Take 500 mg by mouth every 6 (six) hours as needed for moderate pain.   Yes [provider]  allopurinol (ZYLOPRIM) 100 MG tablet Take 300 mg by mouth daily.    Yes [provider]  Calcium Carbonate-Vit D-Min (CALCIUM 1200) 1200-1000 MG-UNIT CHEW Chew 3 tablets by mouth daily.   Yes [provider]  cholecalciferol (VITAMIN D3) 25 MCG (1000 UT) tablet Take 1,000 Units by mouth daily.   Yes [provider]  cyclobenzaprine (FLEXERIL) 10 MG tablet Take 10 mg by mouth at bedtime.  08/19/18  Yes [provider]  DULoxetine  (CYMBALTA) 30 MG capsule Take 30 mg by mouth every evening.  04/09/14  Yes [provider]  lisinopril (PRINIVIL,ZESTRIL) 20 MG tablet Take 20 mg by mouth daily.   Yes [provider]  Multiple Vitamin (MULTIVITAMIN WITH MINERALS) TABS tablet Take 1 tablet by mouth daily.   Yes [provider]  omeprazole (PRILOSEC) 20 MG capsule Take 20 mg by mouth daily.   Yes [provider]  acetaminophen (TYLENOL 8 HOUR ARTHRITIS PAIN) 650 MG CR tablet Take 650-1,300 mg by mouth every 8 (eight) hours as needed for pain.    [provider]  clindamycin (CLEOCIN T) 1 % external solution Apply 1 application topically daily as needed. Hidradenitis suppurativa 08/03/18   [provider]  clindamycin (CLINDAGEL) 1 % gel Apply 1 application topically daily. Hidradenitis suppurativa 08/03/18   [provider]  furosemide (LASIX) 20 MG tablet Take 20 mg by mouth daily as needed for fluid. 06/17/18   [provider]    Family History Family History  Problem Relation Age of Onset  . COPD Father   . Hypertension Other     Social History Social History   Tobacco Use  . Smoking status: Never Smoker  . Smokeless tobacco: Never Used  Substance Use Topics  . Alcohol use: No    Frequency: Never  . Drug use: No     Allergies   Aspirin; Demerol [meperidine]; Nsaids; Other; Salicylates; Dilaudid [hydromorphone hcl]; and Iohexol   Review of Systems Review of Systems 10 Systems reviewed and are negative for acute change except as noted in the HPI.  Physical Exam Updated Vital Signs BP 136/89 (BP Location: Left Arm)   Pulse 64   Temp 98.5 F (36.9 C) (Oral)   Resp 18   Ht 5\' 1"  (1.549 m)   Wt 107.3 kg   SpO2 97%   BMI 44.69 kg/m   Physical Exam Constitutional:      Appearance: She is well-developed.     Comments: Patient appears uncomfortable.  Mental status is normal.  Respiratory status normal  HENT:     Head: Normocephalic and  atraumatic.  Eyes:     Pupils: Pupils are equal, round, and reactive to light.  Neck:     Musculoskeletal: Neck supple.  Cardiovascular:     Rate and Rhythm: Normal rate and regular rhythm.     Heart sounds: Normal heart sounds.  Pulmonary:     Effort: Pulmonary effort is normal.     Breath sounds: Normal breath sounds.  Abdominal:     General: Bowel sounds are normal. There is no distension.     Palpations: Abdomen is soft.     Tenderness: There is abdominal tenderness.     Comments: Moderate/severe central abdominal pain to palpation.  She cannot tolerate deep palpation of the central  abdomen around the umbilicus.  Umbilicus is not erythematous or particularly protuberant no guarding.  Musculoskeletal: Normal range of motion.  Skin:    General: Skin is warm and dry.  Neurological:     Mental Status: She is alert and oriented to person, place, and time.     GCS: GCS eye subscore is 4. GCS verbal subscore is 5. GCS motor subscore is 6.     Coordination: Coordination normal.      ED Treatments / Results  Labs (all labs ordered are listed, but only abnormal results are displayed) Labs Reviewed  COMPREHENSIVE METABOLIC PANEL - Abnormal; Notable for the following components:      Result Value   Glucose, Bld 105 (*)    All other components within normal limits  CBC - Abnormal; Notable for the following components:   WBC 13.2 (*)    RBC 5.33 (*)    Hemoglobin 15.3 (*)    HCT 46.7 (*)    All other components within normal limits  URINALYSIS, ROUTINE W REFLEX MICROSCOPIC - Abnormal; Notable for the following components:   APPearance HAZY (*)    Ketones, ur 80 (*)    Leukocytes,Ua SMALL (*)    All other components within normal limits  LIPASE, BLOOD  LACTIC ACID, PLASMA  LACTIC ACID, PLASMA    EKG None  Radiology Ct Abdomen Pelvis W Contrast  Result Date: 05/09/2019 CLINICAL DATA:  Acute generalized abdominal pain. EXAM: CT ABDOMEN AND PELVIS WITH CONTRAST TECHNIQUE:  Multidetector CT imaging of the abdomen and pelvis was performed using the standard protocol following bolus administration of intravenous contrast. CONTRAST:  100mL OMNIPAQUE IOHEXOL 300 MG/ML  SOLN COMPARISON:  04/11/2014. FINDINGS: Lower chest: No acute abnormality. Hepatobiliary: No focal liver abnormality is seen. Status post cholecystectomy. No biliary dilatation. There is probable underlying hepatic steatosis. Pancreas: Unremarkable. No pancreatic ductal dilatation or surrounding inflammatory changes. Spleen: The spleen is significantly enlarged measuring approximately 17.9 cm craniocaudad. Adrenals/Urinary Tract: Adrenal glands are unremarkable. Kidneys are normal, without renal calculi, focal lesion, or hydronephrosis. Bladder is unremarkable. Stomach/Bowel: There are scattered colonic diverticula without CT evidence of diverticulitis. The appendix is not reliably identified. The patient is status post prior sleeve gastrectomy. There is a small bowel obstruction with a transition point at the level of a small bowel containing ventral wall hernia. There is a small amount of free fluid and fat stranding within the hernia sac. The small bowel proximal to the hernia is dilated with air-fluid levels measuring up to approximately 3.5 cm. The small bowel distal to the hernia is decompressed. There is no evidence of pneumatosis or free air. Vascular/Lymphatic: Aortic atherosclerosis. No enlarged abdominal or pelvic lymph nodes. Reproductive: Status post hysterectomy. No adnexal masses. Other: No significant free fluid or free air within the abdomen or pelvis. Musculoskeletal: There is a unilateral pars defect at L5. There is no displaced fracture. There is a benign-appearing sclerotic lesion in the proximal partially visualized left femur. IMPRESSION: 1. Moderate grade small bowel obstruction secondary to a small bowel containing ventral wall hernia. There is a small amount of free fluid within the hernia sac.  Correlation for signs and symptoms of incarceration or strangulation is recommended. 2. Status post sleeve gastrectomy and cholecystectomy. 3. Splenomegaly. 4. Hepatomegaly with hepatic steatosis. Electronically Signed   By: Katherine Mantlehristopher  Green M.D.   On: 05/09/2019 21:52    Procedures Procedures (including critical care time)  Medications Ordered in ED Medications  sodium chloride flush (NS) 0.9 % injection 3  mL (0 mLs Intravenous Hold 05/09/19 1700)  sodium chloride 0.9 % bolus 1,000 mL (0 mLs Intravenous Stopped 05/09/19 2012)    Followed by  0.9 %  sodium chloride infusion (1,000 mLs Intravenous New Bag/Given 05/09/19 1950)  piperacillin-tazobactam (ZOSYN) IVPB 3.375 g (has no administration in time range)  morphine 4 MG/ML injection 4 mg (4 mg Intravenous Given 05/09/19 1938)  ondansetron (ZOFRAN) injection 4 mg (4 mg Intravenous Given 05/09/19 1936)  diphenhydrAMINE (BENADRYL) injection 25 mg (25 mg Intravenous Given 05/09/19 1940)  diphenhydrAMINE (BENADRYL) injection 25 mg (25 mg Intravenous Given 05/09/19 2122)  iohexol (OMNIPAQUE) 300 MG/ML solution 100 mL (100 mLs Intravenous Contrast Given 05/09/19 2142)     Initial Impression / Assessment and Plan / ED Course  I have reviewed the triage vital signs and the nursing notes.  Pertinent labs & imaging results that were available during my care of the patient were reviewed by me and considered in my medical decision making (see chart for details).  Clinical Course as of May 08 2320  Tue May 09, 2019  2216 Patient reports morphine helped almost immediately.  She reports now she is starting to get some cramping sensation back again and needs more pain medication.   [MP]    Clinical Course User Index [MP] Arby BarrettePfeiffer, Dominick Zertuche, MD      Patient presents with known umbilical hernia and repaired.  CT shows hernia with normal bowel and partial small bowel obstruction.  Fluids initiated with morphine for pain control.  He does have leukocytosis.  Will  give 1 dose of Zosyn and consultation to general surgery.  Dr. Donell BeersByerly will see in the emergency department for definitive treatment.  Final Clinical Impressions(s) / ED Diagnoses   Final diagnoses:  Hernia of abdominal wall  Small bowel obstruction [facility](HCC)    ED Discharge Orders    None       Arby BarrettePfeiffer, Christapher Gillian, MD 05/09/19 2204    Arby BarrettePfeiffer, Keyarah Mcroy, MD 05/09/19 2321

## 2019-05-09 NOTE — ED Notes (Signed)
Pt taken to CT scanner.

## 2019-05-09 NOTE — ED Triage Notes (Signed)
Patient c/o mid abdominal pain, nausea and abdominal bloating since this AM. Patient reports a known hernia and similar pain 2 weeks ago.

## 2019-05-10 ENCOUNTER — Encounter (HOSPITAL_COMMUNITY): Admission: EM | Disposition: A | Discharge status: Home / Self Care | Payer: Self-pay | Source: Home / Self Care

## 2019-05-10 ENCOUNTER — Inpatient Hospital Stay (HOSPITAL_COMMUNITY): Payer: BC Managed Care – PPO | Admitting: Anesthesiology

## 2019-05-10 ENCOUNTER — Encounter (HOSPITAL_COMMUNITY): Payer: Self-pay

## 2019-05-10 ENCOUNTER — Inpatient Hospital Stay (HOSPITAL_COMMUNITY): Payer: BC Managed Care – PPO

## 2019-05-10 DIAGNOSIS — I10 Essential (primary) hypertension: Secondary | ICD-10-CM | POA: Diagnosis present

## 2019-05-10 DIAGNOSIS — Z90711 Acquired absence of uterus with remaining cervical stump: Secondary | ICD-10-CM | POA: Diagnosis not present

## 2019-05-10 DIAGNOSIS — M19011 Primary osteoarthritis, right shoulder: Secondary | ICD-10-CM | POA: Diagnosis present

## 2019-05-10 DIAGNOSIS — R7303 Prediabetes: Secondary | ICD-10-CM | POA: Diagnosis present

## 2019-05-10 DIAGNOSIS — K56609 Unspecified intestinal obstruction, unspecified as to partial versus complete obstruction: Secondary | ICD-10-CM

## 2019-05-10 DIAGNOSIS — Z8249 Family history of ischemic heart disease and other diseases of the circulatory system: Secondary | ICD-10-CM | POA: Diagnosis not present

## 2019-05-10 DIAGNOSIS — K219 Gastro-esophageal reflux disease without esophagitis: Secondary | ICD-10-CM | POA: Diagnosis present

## 2019-05-10 DIAGNOSIS — G4730 Sleep apnea, unspecified: Secondary | ICD-10-CM | POA: Diagnosis present

## 2019-05-10 DIAGNOSIS — M109 Gout, unspecified: Secondary | ICD-10-CM | POA: Diagnosis present

## 2019-05-10 DIAGNOSIS — Z886 Allergy status to analgesic agent status: Secondary | ICD-10-CM | POA: Diagnosis not present

## 2019-05-10 DIAGNOSIS — E669 Obesity, unspecified: Secondary | ICD-10-CM | POA: Diagnosis present

## 2019-05-10 DIAGNOSIS — K430 Incisional hernia with obstruction, without gangrene: Secondary | ICD-10-CM | POA: Diagnosis present

## 2019-05-10 DIAGNOSIS — Z6841 Body Mass Index (BMI) 40.0 and over, adult: Secondary | ICD-10-CM | POA: Diagnosis not present

## 2019-05-10 DIAGNOSIS — Z79899 Other long term (current) drug therapy: Secondary | ICD-10-CM | POA: Diagnosis not present

## 2019-05-10 DIAGNOSIS — Z87442 Personal history of urinary calculi: Secondary | ICD-10-CM | POA: Diagnosis not present

## 2019-05-10 DIAGNOSIS — T884XXA Failed or difficult intubation, initial encounter: Secondary | ICD-10-CM | POA: Diagnosis present

## 2019-05-10 DIAGNOSIS — Z9884 Bariatric surgery status: Secondary | ICD-10-CM | POA: Diagnosis not present

## 2019-05-10 DIAGNOSIS — Z9049 Acquired absence of other specified parts of digestive tract: Secondary | ICD-10-CM | POA: Diagnosis not present

## 2019-05-10 DIAGNOSIS — Z888 Allergy status to other drugs, medicaments and biological substances status: Secondary | ICD-10-CM | POA: Diagnosis not present

## 2019-05-10 DIAGNOSIS — M170 Bilateral primary osteoarthritis of knee: Secondary | ICD-10-CM | POA: Diagnosis present

## 2019-05-10 DIAGNOSIS — Z1159 Encounter for screening for other viral diseases: Secondary | ICD-10-CM | POA: Diagnosis not present

## 2019-05-10 DIAGNOSIS — Z885 Allergy status to narcotic agent status: Secondary | ICD-10-CM | POA: Diagnosis not present

## 2019-05-10 HISTORY — PX: UMBILICAL HERNIA REPAIR: SHX196

## 2019-05-10 HISTORY — DX: Incisional hernia with obstruction, without gangrene: K43.0

## 2019-05-10 LAB — BASIC METABOLIC PANEL
Anion gap: 8 (ref 5–15)
BUN: 11 mg/dL (ref 6–20)
CO2: 25 mmol/L (ref 22–32)
Calcium: 9 mg/dL (ref 8.9–10.3)
Chloride: 109 mmol/L (ref 98–111)
Creatinine, Ser: 0.47 mg/dL (ref 0.44–1.00)
GFR calc Af Amer: >60 mL/min (ref >60)
GFR calc non Af Amer: >60 mL/min (ref >60)
Glucose, Bld: 124 mg/dL — ABNORMAL HIGH (ref 70–99)
Potassium: 3.7 mmol/L (ref 3.5–5.1)
Sodium: 142 mmol/L (ref 135–145)

## 2019-05-10 LAB — CBC
HCT: 43.5 % (ref 36.0–46.0)
Hemoglobin: 13.7 g/dL (ref 12.0–15.0)
MCH: 28.1 pg (ref 26.0–34.0)
MCHC: 31.5 g/dL (ref 30.0–36.0)
MCV: 89.1 fL (ref 80.0–100.0)
Platelets: 248 10*3/uL (ref 150–400)
RBC: 4.88 MIL/uL (ref 3.87–5.11)
RDW: 14.9 % (ref 11.5–15.5)
WBC: 11.4 10*3/uL — ABNORMAL HIGH (ref 4.0–10.5)
nRBC: 0 % (ref 0.0–0.2)

## 2019-05-10 LAB — SARS CORONAVIRUS 2 BY RT PCR (HOSPITAL ORDER, PERFORMED IN ~~LOC~~ HOSPITAL LAB): SARS Coronavirus 2: NEGATIVE

## 2019-05-10 LAB — SURGICAL PCR SCREEN
MRSA, PCR: NEGATIVE
Staphylococcus aureus: NEGATIVE

## 2019-05-10 LAB — HIV ANTIBODY (ROUTINE TESTING W REFLEX): HIV Screen 4th Generation wRfx: NONREACTIVE

## 2019-05-10 SURGERY — REPAIR, HERNIA, UMBILICAL, ADULT
Anesthesia: General | Site: Abdomen

## 2019-05-10 MED ORDER — ACETAMINOPHEN 325 MG PO TABS: 650.0000 mg | ORAL_TABLET | Freq: Four times a day (QID) | ORAL | Status: DC | PRN

## 2019-05-10 MED ORDER — METHOCARBAMOL 1000 MG/10ML IJ SOLN
500.0000 mg | Freq: Four times a day (QID) | INTRAVENOUS | Status: DC | PRN
  Filled 2019-05-10: qty 5

## 2019-05-10 MED ORDER — PROMETHAZINE HCL 25 MG/ML IJ SOLN: 6.2500 mg | INTRAMUSCULAR | Status: DC | PRN

## 2019-05-10 MED ORDER — HYDROCODONE-ACETAMINOPHEN 5-325 MG PO TABS: 1.0000 | ORAL_TABLET | ORAL | Status: DC | PRN

## 2019-05-10 MED ORDER — FENTANYL CITRATE (PF) 250 MCG/5ML IJ SOLN
INTRAMUSCULAR | Status: AC
  Filled 2019-05-10: qty 5

## 2019-05-10 MED ORDER — PROCHLORPERAZINE EDISYLATE 10 MG/2ML IJ SOLN
5.0000 mg | Freq: Four times a day (QID) | INTRAMUSCULAR | Status: DC | PRN
  Administered 2019-05-10: 10 mg via INTRAVENOUS
  Filled 2019-05-10: qty 2

## 2019-05-10 MED ORDER — MORPHINE SULFATE (PF) 2 MG/ML IV SOLN
1.0000 mg | INTRAVENOUS | Status: DC | PRN
  Administered 2019-05-10 – 2019-05-11 (×2): 1 mg via INTRAVENOUS
  Filled 2019-05-10 (×2): qty 1

## 2019-05-10 MED ORDER — ENOXAPARIN SODIUM 40 MG/0.4ML ~~LOC~~ SOLN: 40.0000 mg | SUBCUTANEOUS | Status: DC

## 2019-05-10 MED ORDER — ACETAMINOPHEN 650 MG RE SUPP: 650.0000 mg | Freq: Four times a day (QID) | RECTAL | Status: DC | PRN

## 2019-05-10 MED ORDER — LACTATED RINGERS IV SOLN
INTRAVENOUS | Status: DC
  Administered 2019-05-10 (×2): via INTRAVENOUS

## 2019-05-10 MED ORDER — PROMETHAZINE HCL 25 MG/ML IJ SOLN
12.5000 mg | Freq: Four times a day (QID) | INTRAMUSCULAR | Status: DC | PRN
  Administered 2019-05-10: 12.5 mg via INTRAVENOUS
  Filled 2019-05-10: qty 1

## 2019-05-10 MED ORDER — LACTATED RINGERS IV SOLN
INTRAVENOUS | Status: DC
  Administered 2019-05-11 – 2019-05-12 (×2): via INTRAVENOUS

## 2019-05-10 MED ORDER — MORPHINE SULFATE (PF) 2 MG/ML IV SOLN
1.0000 mg | INTRAVENOUS | Status: DC | PRN
  Administered 2019-05-10: 04:00:00 2 mg via INTRAVENOUS
  Filled 2019-05-10: qty 1

## 2019-05-10 MED ORDER — ONDANSETRON HCL 4 MG/2ML IJ SOLN: 4.0000 mg | Freq: Four times a day (QID) | INTRAMUSCULAR | Status: DC | PRN

## 2019-05-10 MED ORDER — OXYCODONE HCL 5 MG PO TABS: 5.0000 mg | ORAL_TABLET | ORAL | Status: DC | PRN

## 2019-05-10 MED ORDER — ONDANSETRON HCL 4 MG/2ML IJ SOLN
INTRAMUSCULAR | Status: DC | PRN
  Administered 2019-05-10: 4 mg via INTRAVENOUS

## 2019-05-10 MED ORDER — SCOPOLAMINE 1 MG/3DAYS TD PT72
MEDICATED_PATCH | TRANSDERMAL | Status: AC
  Filled 2019-05-10: qty 1

## 2019-05-10 MED ORDER — ROCURONIUM BROMIDE 10 MG/ML (PF) SYRINGE
PREFILLED_SYRINGE | INTRAVENOUS | Status: DC | PRN
  Administered 2019-05-10: 10 mg via INTRAVENOUS
  Administered 2019-05-10: 40 mg via INTRAVENOUS
  Administered 2019-05-10: 10 mg via INTRAVENOUS

## 2019-05-10 MED ORDER — HYDRALAZINE HCL 20 MG/ML IJ SOLN: 10.0000 mg | INTRAMUSCULAR | Status: DC | PRN

## 2019-05-10 MED ORDER — SUGAMMADEX SODIUM 200 MG/2ML IV SOLN
INTRAVENOUS | Status: DC | PRN
  Administered 2019-05-10: 200 mg via INTRAVENOUS

## 2019-05-10 MED ORDER — SODIUM CHLORIDE 0.9 % IV SOLN
2.0000 g | INTRAVENOUS | Status: AC
  Administered 2019-05-10: 2 g via INTRAVENOUS
  Filled 2019-05-10: qty 2

## 2019-05-10 MED ORDER — DIPHENHYDRAMINE HCL 12.5 MG/5ML PO ELIX: 12.5000 mg | ORAL_SOLUTION | Freq: Four times a day (QID) | ORAL | Status: DC | PRN

## 2019-05-10 MED ORDER — DEXTROSE IN LACTATED RINGERS 5 % IV SOLN
INTRAVENOUS | Status: DC
  Administered 2019-05-10: 05:00:00 via INTRAVENOUS

## 2019-05-10 MED ORDER — CEFAZOLIN SODIUM-DEXTROSE 2-4 GM/100ML-% IV SOLN
2.0000 g | Freq: Three times a day (TID) | INTRAVENOUS | Status: AC
  Administered 2019-05-10: 2 g via INTRAVENOUS
  Filled 2019-05-10: qty 100

## 2019-05-10 MED ORDER — MIDAZOLAM HCL 5 MG/5ML IJ SOLN
INTRAMUSCULAR | Status: DC | PRN
  Administered 2019-05-10 (×2): 1 mg via INTRAVENOUS

## 2019-05-10 MED ORDER — MORPHINE SULFATE (PF) 4 MG/ML IV SOLN
1.0000 mg | INTRAVENOUS | Status: DC | PRN
  Administered 2019-05-10 (×2): 1 mg via INTRAVENOUS

## 2019-05-10 MED ORDER — PANTOPRAZOLE SODIUM 40 MG IV SOLR
40.0000 mg | Freq: Every day | INTRAVENOUS | Status: DC
  Administered 2019-05-10 – 2019-05-11 (×2): 40 mg via INTRAVENOUS
  Filled 2019-05-10 (×2): qty 40

## 2019-05-10 MED ORDER — DEXAMETHASONE SODIUM PHOSPHATE 10 MG/ML IJ SOLN
INTRAMUSCULAR | Status: DC | PRN
  Administered 2019-05-10: 10 mg via INTRAVENOUS

## 2019-05-10 MED ORDER — PROMETHAZINE HCL 25 MG/ML IJ SOLN: 12.5000 mg | Freq: Four times a day (QID) | INTRAMUSCULAR | Status: DC | PRN

## 2019-05-10 MED ORDER — MORPHINE SULFATE (PF) 4 MG/ML IV SOLN
INTRAVENOUS | Status: AC
  Administered 2019-05-10: 16:00:00 1 mg via INTRAVENOUS
  Filled 2019-05-10: qty 1

## 2019-05-10 MED ORDER — SCOPOLAMINE 1 MG/3DAYS TD PT72
MEDICATED_PATCH | TRANSDERMAL | Status: DC | PRN
  Administered 2019-05-10: 1 via TRANSDERMAL

## 2019-05-10 MED ORDER — ROCURONIUM BROMIDE 10 MG/ML (PF) SYRINGE
PREFILLED_SYRINGE | INTRAVENOUS | Status: AC
  Filled 2019-05-10: qty 10

## 2019-05-10 MED ORDER — PROCHLORPERAZINE MALEATE 10 MG PO TABS: 10.0000 mg | ORAL_TABLET | Freq: Four times a day (QID) | ORAL | Status: DC | PRN

## 2019-05-10 MED ORDER — SUGAMMADEX SODIUM 200 MG/2ML IV SOLN
INTRAVENOUS | Status: AC
  Filled 2019-05-10: qty 2

## 2019-05-10 MED ORDER — ONDANSETRON 4 MG PO TBDP: 4.0000 mg | ORAL_TABLET | Freq: Four times a day (QID) | ORAL | Status: DC | PRN

## 2019-05-10 MED ORDER — FENTANYL CITRATE (PF) 100 MCG/2ML IJ SOLN
INTRAMUSCULAR | Status: DC | PRN
  Administered 2019-05-10 (×5): 50 ug via INTRAVENOUS

## 2019-05-10 MED ORDER — 0.9 % SODIUM CHLORIDE (POUR BTL) OPTIME
TOPICAL | Status: DC | PRN
  Administered 2019-05-10: 1000 mL

## 2019-05-10 MED ORDER — PROPOFOL 10 MG/ML IV BOLUS
INTRAVENOUS | Status: DC | PRN
  Administered 2019-05-10: 120 mg via INTRAVENOUS

## 2019-05-10 MED ORDER — DIPHENHYDRAMINE HCL 50 MG/ML IJ SOLN: 12.5000 mg | Freq: Four times a day (QID) | INTRAMUSCULAR | Status: DC | PRN

## 2019-05-10 MED ORDER — ENOXAPARIN SODIUM 40 MG/0.4ML ~~LOC~~ SOLN
40.0000 mg | SUBCUTANEOUS | Status: DC
  Administered 2019-05-11 – 2019-05-12 (×2): 40 mg via SUBCUTANEOUS
  Filled 2019-05-10 (×2): qty 0.4

## 2019-05-10 MED ORDER — SUCCINYLCHOLINE CHLORIDE 20 MG/ML IJ SOLN
INTRAMUSCULAR | Status: DC | PRN
  Administered 2019-05-10: 120 mg via INTRAVENOUS

## 2019-05-10 MED ORDER — MIDAZOLAM HCL 2 MG/2ML IJ SOLN
INTRAMUSCULAR | Status: AC
  Filled 2019-05-10: qty 2

## 2019-05-10 MED ORDER — LIDOCAINE HCL (CARDIAC) PF 100 MG/5ML IV SOSY
PREFILLED_SYRINGE | INTRAVENOUS | Status: DC | PRN
  Administered 2019-05-10: 100 mg via INTRAVENOUS

## 2019-05-10 SURGICAL SUPPLY — 49 items
APPLICATOR COTTON TIP 6 STRL (MISCELLANEOUS) IMPLANT
APPLICATOR COTTON TIP 6IN STRL (MISCELLANEOUS)
BINDER ABDOMINAL 12 ML 46-62 (SOFTGOODS) ×3 IMPLANT
BLADE EXTENDED COATED 6.5IN (ELECTRODE) IMPLANT
BLADE HEX COATED 2.75 (ELECTRODE) ×3 IMPLANT
COVER MAYO STAND STRL (DRAPES) IMPLANT
COVER SURGICAL LIGHT HANDLE (MISCELLANEOUS) ×3 IMPLANT
COVER WAND RF STERILE (DRAPES) IMPLANT
DRAIN CHANNEL 19F RND (DRAIN) ×3 IMPLANT
DRAPE LAPAROSCOPIC ABDOMINAL (DRAPES) ×3 IMPLANT
DRAPE WARM FLUID 44X44 (DRAPES) IMPLANT
DRSG PAD ABDOMINAL 8X10 ST (GAUZE/BANDAGES/DRESSINGS) ×3 IMPLANT
ELECT REM PT RETURN 15FT ADLT (MISCELLANEOUS) ×3 IMPLANT
EVACUATOR SILICONE 100CC (DRAIN) ×3 IMPLANT
GAUZE SPONGE 4X4 12PLY STRL (GAUZE/BANDAGES/DRESSINGS) ×3 IMPLANT
GAUZE XEROFORM 5X9 LF (GAUZE/BANDAGES/DRESSINGS) ×3 IMPLANT
GLOVE BIOGEL PI IND STRL 6.5 (GLOVE) ×1 IMPLANT
GLOVE BIOGEL PI IND STRL 7.0 (GLOVE) ×1 IMPLANT
GLOVE BIOGEL PI INDICATOR 6.5 (GLOVE) ×2
GLOVE BIOGEL PI INDICATOR 7.0 (GLOVE) ×2
GLOVE ECLIPSE 6.5 STRL STRAW (GLOVE) ×3 IMPLANT
GLOVE EUDERMIC 7 POWDERFREE (GLOVE) ×3 IMPLANT
GLOVE SURG SS PI 7.0 STRL IVOR (GLOVE) ×3 IMPLANT
GOWN STRL REUS W/TWL LRG LVL3 (GOWN DISPOSABLE) ×3 IMPLANT
GOWN STRL REUS W/TWL XL LVL3 (GOWN DISPOSABLE) ×3 IMPLANT
HANDLE SUCTION POOLE (INSTRUMENTS) IMPLANT
KIT BASIN OR (CUSTOM PROCEDURE TRAY) ×3 IMPLANT
KIT TURNOVER KIT A (KITS) ×3 IMPLANT
LIGASURE IMPACT 36 18CM CVD LR (INSTRUMENTS) ×3 IMPLANT
PACK GENERAL/GYN (CUSTOM PROCEDURE TRAY) ×3 IMPLANT
SPONGE DRAIN TRACH 4X4 STRL 2S (GAUZE/BANDAGES/DRESSINGS) ×3 IMPLANT
SPONGE LAP 18X18 RF (DISPOSABLE) IMPLANT
STAPLER VISISTAT 35W (STAPLE) IMPLANT
SUCTION POOLE HANDLE (INSTRUMENTS)
SUT ETHILON 3 0 PS 1 (SUTURE) ×3 IMPLANT
SUT NOVA 1 T20/GS 25DT (SUTURE) ×6 IMPLANT
SUT PDS AB 1 CTX 36 (SUTURE) IMPLANT
SUT PDS AB 1 TP1 96 (SUTURE) IMPLANT
SUT SILK 2 0 (SUTURE)
SUT SILK 2 0 SH CR/8 (SUTURE) IMPLANT
SUT SILK 2-0 18XBRD TIE 12 (SUTURE) IMPLANT
SUT SILK 3 0 (SUTURE)
SUT SILK 3 0 SH CR/8 (SUTURE) IMPLANT
SUT SILK 3-0 18XBRD TIE 12 (SUTURE) IMPLANT
SUT VIC AB 3-0 SH 18 (SUTURE) ×3 IMPLANT
SUT VICRYL 2 0 18  UND BR (SUTURE)
SUT VICRYL 2 0 18 UND BR (SUTURE) IMPLANT
TOWEL OR 17X26 10 PK STRL BLUE (TOWEL DISPOSABLE) ×3 IMPLANT
YANKAUER SUCT BULB TIP NO VENT (SUCTIONS) IMPLANT

## 2019-05-10 NOTE — ED Notes (Signed)
ED TO INPATIENT HANDOFF REPORT  Name/Age/Gender [name] 58 y.o. female  Code Status Code Status History    Date Active Date Inactive Code Status Order ID Comments User Context   08/30/2018 1144 08/31/2018 1807 Full Code [id number]  Ovidio KinNewman, David, MD Inpatient   05/21/2014 1758 05/21/2014 2246 Full Code [id number]  Larey DresserAjlouny, Martha, DPM Inpatient      Home/SNF/Other Home  Chief Complaint Abd pain, umbilical hernia  Level of Care/Admitting Diagnosis ED Disposition    ED Disposition Condition Comment   Admit  [facility] [[id number]]  Level of Care: Med-Surg [16]  Covid Evaluation: Screening Protocol (No Symptoms)  Diagnosis: Incarcerated umbilical hernia [[id number]][[id number]]  Admitting Physician: CCS, MD [[id number]]  Attending Physician: CCS, MD [[id number]]  Estimated length of stay: past midnight tomorrow  Certification:: I certify this patient will need inpatient services for at least 2 midnights  PT Class (Do Not Modify): Inpatient [101]  PT Acc Code (Do Not Modify): Private [1]       Medical History Past Medical History:  Diagnosis Date  . Arthritis    knees, right shoulder  . Carpal tunnel syndrome of left wrist 04/2013  . Dental crowns present   . Depression   . Difficult intubation   . Esophageal stricture    hx. of esophageal dilation x 1  . GERD (gastroesophageal reflux disease)   . Gout   . History of difficult intubation hysterectomy   letter from 12-12-2008 on chart, 2 more surgeries since then no intubation problems  . History of kidney stones    x 2 passed on own  . Hypertension    under control with med., has been on med. x 5 yr.  . Muscle pain   . PONV (postoperative nausea and vomiting)    nausea  . Pre-diabetes    pt does not check cbg at home  . Sleep apnea    uses CPAP nightly set on 12.0  . Umbilical hernia     Allergies Allergies  Allergen Reactions  . Aspirin Itching and Swelling  . Demerol [Meperidine] Nausea And Vomiting   . Nsaids Itching and Swelling  . Other Nausea Only    PERFUMES CAUSE HEADACHE  . Salicylates Itching and Swelling  . Dilaudid [Hydromorphone Hcl] Itching  . Iohexol Itching         IV Location/Drains/Wounds Patient Lines/Drains/Airways Status   Active Line/Drains/Airways    Name:   Placement date:   Placement time:   Site:   Days:   Peripheral IV 05/09/19 Left;Lateral Antecubital   05/09/19    1727    Antecubital   1   NG/OG Tube Nasogastric 14 Fr. Right nare Xray   05/10/19    0137    Right nare   less than 1   Incision 05/11/13 Hand Left   05/11/13    0758     2190   Incision 05/11/13 Hand Left   05/11/13    0758     2190   Incision (Closed) 05/21/14 Foot Right   05/21/14    1744     1815   Incision (Closed) 08/30/18 Abdomen   08/30/18    0829     253   Incision - 6 Ports Abdomen Right;Lateral Right;Medial Left;Lateral Left;Umbilicus Mid;Upper Left   08/30/18    [id number]     253          Labs/Imaging Results for orders placed or performed during the hospital encounter of 05/09/19 (  from the past 48 hour(s))  Lipase, blood     Status: None   Collection Time: 05/09/19  4:20 PM  Result Value Ref Range   Lipase 25 11 - 51 U/L    Comment: Performed at [facility], [place] [address] [place], [place] [address]  Comprehensive metabolic panel     Status: Abnormal   Collection Time: 05/09/19  4:20 PM  Result Value Ref Range   Sodium 141 135 - 145 mmol/L   Potassium 3.5 3.5 - 5.1 mmol/L   Chloride 105 98 - 111 mmol/L   CO2 25 22 - 32 mmol/L   Glucose, Bld 105 (H) 70 - 99 mg/dL   BUN 17 6 - 20 mg/dL   Creatinine, Ser 0.54 0.44 - 1.00 mg/dL   Calcium 10.0 8.9 - 10.3 mg/dL   Total Protein 7.8 6.5 - 8.1 g/dL   Albumin 4.5 3.5 - 5.0 g/dL   AST 20 15 - 41 U/L   ALT 13 0 - 44 U/L   Alkaline Phosphatase 67 38 - 126 U/L   Total Bilirubin 0.5 0.3 - 1.2 mg/dL   GFR calc non Af Amer >60 >60 mL/min   GFR calc Af Amer >60 >60 mL/min   Anion gap 11 5 - 15    Comment:  Performed at [facility], [place] [address] [place], [place] [address]  CBC     Status: Abnormal   Collection Time: 05/09/19  4:20 PM  Result Value Ref Range   WBC 13.2 (H) 4.0 - 10.5 K/uL   RBC 5.33 (H) 3.87 - 5.11 MIL/uL   Hemoglobin 15.3 (H) 12.0 - 15.0 g/dL   HCT 46.7 (H) 36.0 - 46.0 %   MCV 87.6 80.0 - 100.0 fL   MCH 28.7 26.0 - 34.0 pg   MCHC 32.8 30.0 - 36.0 g/dL   RDW 14.8 11.5 - 15.5 %   Platelets 314 150 - 400 K/uL   nRBC 0.0 0.0 - 0.2 %    Comment: Performed at [facility], [place] [address], [place], [place] [address]  Urinalysis, Routine w reflex microscopic     Status: Abnormal   Collection Time: 05/09/19  5:28 PM  Result Value Ref Range   Color, Urine YELLOW YELLOW   APPearance HAZY (A) CLEAR   Specific Gravity, Urine 1.026 1.005 - 1.030   pH 7.0 5.0 - 8.0   Glucose, UA NEGATIVE NEGATIVE mg/dL   Hgb urine dipstick NEGATIVE NEGATIVE   Bilirubin Urine NEGATIVE NEGATIVE   Ketones, ur 80 (A) NEGATIVE mg/dL   Protein, ur NEGATIVE NEGATIVE mg/dL   Nitrite NEGATIVE NEGATIVE   Leukocytes,Ua SMALL (A) NEGATIVE   RBC / HPF 0-5 0 - 5 RBC/hpf   WBC, UA 6-10 0 - 5 WBC/hpf   Bacteria, UA NONE SEEN NONE SEEN   Squamous Epithelial / LPF 11-20 0 - 5   Mucus PRESENT     Comment: Performed at [facility], [place] [address], [place], [place] [address]  SARS Coronavirus 2 (CEPHEID - Performed in [place] hospital lab), Hosp Order     Status: None   Collection Time: 05/09/19 10:07 PM  Result Value Ref Range   SARS Coronavirus 2 NEGATIVE NEGATIVE    Comment: (NOTE) If result is NEGATIVE SARS-CoV-2 target nucleic acids are NOT DETECTED. The SARS-CoV-2 RNA is generally detectable in upper and lower  respiratory specimens during the acute phase of infection. The lowest  concentration of SARS-CoV-2 viral copies  this assay can detect is 250  copies / mL. A negative result does not preclude SARS-CoV-2 infection  and should not be  used as the sole basis for treatment or other  patient management decisions.  A negative result may occur with  improper specimen collection / handling, submission of specimen other  than nasopharyngeal swab, presence of viral mutation(s) within the  areas targeted by this assay, and inadequate number of viral copies  (<250 copies / mL). A negative result must be combined with clinical  observations, patient history, and epidemiological information. If result is POSITIVE SARS-CoV-2 target nucleic acids are DETECTED. The SARS-CoV-2 RNA is generally detectable in upper and lower  respiratory specimens dur ing the acute phase of infection.  Positive  results are indicative of active infection with SARS-CoV-2.  Clinical  correlation with patient history and other diagnostic information is  necessary to determine patient infection status.  Positive results do  not rule out bacterial infection or co-infection with other viruses. If result is PRESUMPTIVE POSTIVE SARS-CoV-2 nucleic acids MAY BE PRESENT.   A presumptive positive result was obtained on the submitted specimen  and confirmed on repeat testing.  While 2019 novel coronavirus  (SARS-CoV-2) nucleic acids may be present in the submitted sample  additional confirmatory testing may be necessary for epidemiological  and / or clinical management purposes  to differentiate between  SARS-CoV-2 and other Sarbecovirus currently known to infect humans.  If clinically indicated additional testing with an alternate test  methodology [phone]) is advised. The SARS-CoV-2 RNA is generally  detectable in upper and lower respiratory sp ecimens during the acute  phase of infection. The expected result is Negative. Fact Sheet for Patients:  BoilerBrush.com.cy Fact Sheet for Healthcare Providers: https://pope.com/ This test is not yet approved or cleared by the [place] FDA and has been authorized  for detection and/or diagnosis of SARS-CoV-2 by FDA under an Emergency Use Authorization (EUA).  This EUA will remain in effect (meaning this test can be used) for the duration of the COVID-19 declaration under Section 564(b)(1) of the Act, 21 U.S.C. section 360bbb-3(b)(1), unless the authorization is terminated or revoked sooner. Performed at [facility], [address], [place], [place] [address]   Lactic acid, plasma     Status: None   Collection Time: 05/09/19 10:16 PM  Result Value Ref Range   Lactic Acid, Venous 0.8 0.5 - 1.9 mmol/L    Comment: Performed at [facility], [address] [place], [place] [address]   Ct Abdomen Pelvis W Contrast  Result Date: 05/09/2019 CLINICAL DATA:  Acute generalized abdominal pain. EXAM: CT ABDOMEN AND PELVIS WITH CONTRAST TECHNIQUE: Multidetector CT imaging of the abdomen and pelvis was performed using the standard protocol following bolus administration of intravenous contrast. CONTRAST:  OMNIPAQUE IOHEXOL 300 MG/ML  SOLN COMPARISON:  04/11/2014. FINDINGS: Lower chest: No acute abnormality. Hepatobiliary: No focal liver abnormality is seen. Status post cholecystectomy. No biliary dilatation. There is probable underlying hepatic steatosis. Pancreas: Unremarkable. No pancreatic ductal dilatation or surrounding inflammatory changes. Spleen: The spleen is significantly enlarged measuring approximately 17.9 cm craniocaudad. Adrenals/Urinary Tract: Adrenal glands are unremarkable. Kidneys are normal, without renal calculi, focal lesion, or hydronephrosis. Bladder is unremarkable. Stomach/Bowel: There are scattered colonic diverticula without CT evidence of diverticulitis. The appendix is not reliably identified. The patient is status post prior sleeve gastrectomy. There is a small bowel obstruction with a transition point at the level of a small bowel containing ventral wall  hernia. There is a small amount of free  fluid and fat stranding within the hernia sac. The small bowel proximal to the hernia is dilated with air-fluid levels measuring up to approximately 3.5 cm. The small bowel distal to the hernia is decompressed. There is no evidence of pneumatosis or free air. Vascular/Lymphatic: Aortic atherosclerosis. No enlarged abdominal or pelvic lymph nodes. Reproductive: Status post hysterectomy. No adnexal masses. Other: No significant free fluid or free air within the abdomen or pelvis. Musculoskeletal: There is a unilateral pars defect at L5. There is no displaced fracture. There is a benign-appearing sclerotic lesion in the proximal partially visualized left femur. IMPRESSION: 1. Moderate grade small bowel obstruction secondary to a small bowel containing ventral wall hernia. There is a small amount of free fluid within the hernia sac. Correlation for signs and symptoms of incarceration or strangulation is recommended. 2. Status post sleeve gastrectomy and cholecystectomy. 3. Splenomegaly. 4. Hepatomegaly with hepatic steatosis. Electronically Signed   By: Katherine Mantlehristopher  Green M.D.   On: 05/09/2019 21:52   Dg Abd Portable 1 View  Result Date: 05/10/2019 CLINICAL DATA:  NG tube placement EXAM: PORTABLE ABDOMEN - 1 VIEW COMPARISON:  CT from the same day. FINDINGS: The enteric tube projects over the gastric antrum. Dilated loops of small bowel are again noted. Contrast is noted within the urinary bladder. IMPRESSION: NG tube tip projects over the gastric antrum. Electronically Signed   By: Katherine Mantlehristopher  Green M.D.   On: 05/10/2019 02:29    Pending Labs [employer]ed Labs (From admission, onward)    Start     Ordered   Signed and Held  Novel Coronavirus, NAA (hospital order; send-out to ref lab)  Once,   R    Comments:  No isolation needed for this testing (if isolation ordered for another indication, maintain current isolation).   Question:  Pre-procedural testing  Answer:  Yes   Signed and Held   Signed and Held   HIV antibody (Routine Testing)  Once,   R     Signed and Held   Signed and Held  CBC  (enoxaparin (LOVENOX)    CrCl >/= 30 ml/min)  Once,   R    Comments:  Baseline for enoxaparin therapy IF NOT ALREADY DRAWN.  Notify MD if PLT < 100 K.    Signed and Held   Signed and Held  Creatinine, serum  (enoxaparin (LOVENOX)    CrCl >/= 30 ml/min)  Once,   R    Comments:  Baseline for enoxaparin therapy IF NOT ALREADY DRAWN.    Signed and Held   Signed and Held  Creatinine, serum  (enoxaparin (LOVENOX)    CrCl >/= 30 ml/min)  Weekly,   R    Comments:  while on enoxaparin therapy    Signed and Held   Signed and Held  Basic metabolic panel  Tomorrow morning,   R     Signed and Held   Signed and Held  CBC  Tomorrow morning,   R     Signed and Held          Vitals/Pain Today's Vitals   05/09/19 2211 05/09/19 2300 05/10/19 0113 05/10/19 0229  BP: (!) 151/93 (!) 143/89 (!) 127/93   Pulse: 66 66 65   Resp: 15  18   Temp:      TempSrc:      SpO2: 100% 99% 97%   Weight:      Height:      PainSc:  Asleep    Isolation Precautions No active isolations  Medications Medications  sodium chloride flush (NS) 0.9 % injection 3 mL (0 mLs Intravenous Hold 05/09/19 1700)  sodium chloride 0.9 % bolus 1,000 mL (0 mLs Intravenous Stopped 05/09/19 2012)    Followed by  0.9 %  sodium chloride infusion (1,000 mLs Intravenous New Bag/Given 05/09/19 1950)  sodium chloride (PF) 0.9 % injection (0 mLs Intravenous Hold 05/10/19 0059)  promethazine (PHENERGAN) injection 12.5 mg (12.5 mg Intravenous Given 05/10/19 0056)  morphine 4 MG/ML injection 4 mg (4 mg Intravenous Given 05/09/19 1938)  ondansetron (ZOFRAN) injection 4 mg (4 mg Intravenous Given 05/09/19 1936)  diphenhydrAMINE (BENADRYL) injection 25 mg (25 mg Intravenous Given 05/09/19 1940)  diphenhydrAMINE (BENADRYL) injection 25 mg (25 mg Intravenous Given 05/09/19 2122)  iohexol (OMNIPAQUE) 300 MG/ML solution 100 mL (100 mLs Intravenous Contrast Given 05/09/19  2142)  piperacillin-tazobactam (ZOSYN) IVPB 3.375 g (0 g Intravenous Stopped 05/09/19 2248)  morphine 4 MG/ML injection 4 mg (4 mg Intravenous Given 05/09/19 2221)    Mobility walks

## 2019-05-10 NOTE — Anesthesia Postprocedure Evaluation (Signed)
Anesthesia Post Note  Patient: [name]  Procedure(s) Performed: REPAIR OF INCARCERATED INCISIONAL HERNIA, PARTIAL OMENTECTOMY (N/A Abdomen)     Patient location during evaluation: PACU Anesthesia Type: General Level of consciousness: awake and alert Pain management: pain level controlled Vital Signs Assessment: post-procedure vital signs reviewed and stable Respiratory status: spontaneous breathing, nonlabored ventilation, respiratory function stable and patient connected to nasal cannula oxygen Cardiovascular status: blood pressure returned to baseline and stable Postop Assessment: no apparent nausea or vomiting Anesthetic complications: no    Last Vitals:  Vitals:   05/10/19 1653 05/10/19 1730  BP: 132/88 122/87  Pulse: 71 67  Resp: 12 18  Temp: 37 C 36.9 C  SpO2: 98% 94%    Last Pain:  Vitals:   05/10/19 1730  TempSrc: Oral  PainSc:                  Darick Fetters S

## 2019-05-10 NOTE — Transfer of Care (Signed)
Immediate Anesthesia Transfer of Care Note  Patient: [name]  Procedure(s) Performed: REPAIR OF INCARCERATED INCISIONAL HERNIA, PARTIAL OMENTECTOMY (N/A Abdomen)  Patient Location: PACU  Anesthesia Type:General  Level of Consciousness: awake, alert  and oriented  Airway & Oxygen Therapy: Patient Spontanous Breathing and Patient connected to face mask oxygen  Post-op Assessment: Report given to RN and Post -op Vital signs reviewed and stable  Post vital signs: Reviewed and stable  Last Vitals:  Vitals Value Taken Time  BP 152/90 05/10/2019  4:15 PM  Temp    Pulse 69 05/10/2019  4:16 PM  Resp 11 05/10/2019  4:16 PM  SpO2 96 % 05/10/2019  4:16 PM  Vitals shown include unvalidated device data.  Last Pain:  Vitals:   05/10/19 1303  TempSrc: Oral  PainSc:       Patients Stated Pain Goal: 3 (41/32/44 0102)  Complications: No apparent anesthesia complications

## 2019-05-10 NOTE — Op Note (Addendum)
Patient Name:           Amanda HoyleRita S Mcneel   Date of Surgery:        05/10/2019  Pre op Diagnosis:      Incarcerated incisional hernia with small bowel obstruction  Post op Diagnosis:    Same  Procedure:                 Open repair of incarcerated incisional hernia, partial omentectomy, resection of umbilicus and skin revision  Surgeon:                     Angelia MouldHaywood M. Derrell LollingIngram, M.D., FACS  Assistant:                      Or staff  Operative Indications:   This is a 58 year old woman who has a history of sleeve gastrectomy.  More remotely she has a history of laparoscopic supracervical hysterectomy utilizing umbilical incision.  Also history cholecystectomy.  She presented to the emergency department yesterday with generalized abdominal pain.  She had had a similar episode several weeks ago but that resolved.  She has a known history of umbilical hernia for years.  She was nauseated but did not vomit.  On exam she had a tender palpable mass centered at and below the umbilicus.  CT scan showed incarcerated umbilical hernia involving omentum and small bowel with obstruction.  Dr. Donell BeersByerly partially reduce this and she felt a bit better.  She is brought to the operating room for repair  Operative Findings:       The small bowel had been reduced and was viable.  There was a large grapefruit sized amount of omentum caught in the hernia sac.  This required extensive debridement.  It was inflamed but not infarcted.  I had to resect the omentum with the LigaSure device to control bleeding.  This was a single defect.  I do not feel any defects above or below.  This was closed primarily with about 10 interrupted sutures of #1 Novafil.  A drain was placed because of the large dead space from the hernia sac  Procedure in Detail:          Following the induction of general endotracheal anesthesia the patient's abdomen was prepped and draped in a sterile fashion.  Intravenous antibiotics were given.  Surgical timeout was  performed.        A transverse incision was made below the umbilicus.  Ultimately in the skin above was damaged and ischemic and had to be resected with another elliptical incision.  Dissection was carried down through the subcutaneous tissue.  The hernia sac was slowly dissected away from the subcutaneous tissues.  We eventually found an opening into the peritoneal cavity.  We were able to open up the hernia sac safely.  We debrided the hernia sac.  We resected the omentum.  We control several bleeders with cautery and the LigaSure device.  We irrigated and found no more bleeding.  The fascia was closed transversely with interrupted sutures of #1 Novafil.  We were sure to undermined above and below to make sure we had a secure shelving edge of fascia.  The wound was irrigated.  19 [place] drain was placed inferiorly in the large dead space and brought out through a separate stab incision to the left and connected to a suction bulb.  The subcutaneous tissue was closed with 3-0 Vicryl sutures and the skin closed with skin  staples.  Clean bandages and abdominal binder was placed.  The patient tolerated the procedure well and was taken to PACU in stable condition.  EBL 50 cc.  Counts correct.  Complications none.   Contact:  Spoke to Ayme Short (husband) [phone]     Edsel Petrin. Dalbert Batman, M.D., FACS General and Minimally Invasive Surgery Breast and Colorectal Surgery  05/10/2019 4:07 PM

## 2019-05-10 NOTE — Anesthesia Procedure Notes (Signed)
Procedure Name: Intubation Date/Time: 05/10/2019 2:44 PM Performed by: Glory Buff, CRNA Pre-anesthesia Checklist: Patient identified, Emergency Drugs available, Suction available and Patient being monitored Patient Re-evaluated:Patient Re-evaluated prior to induction Oxygen Delivery Method: Circle system utilized Preoxygenation: Pre-oxygenation with 100% oxygen Induction Type: Rapid sequence, Cricoid Pressure applied and IV induction Laryngoscope Size: Glidescope and 4 Grade View: Grade I Tube type: Oral Tube size: 7.0 mm Number of attempts: 1 Airway Equipment and Method: Stylet and Oral airway Placement Confirmation: ETT inserted through vocal cords under direct vision,  positive ETCO2 and breath sounds checked- equal and bilateral Secured at: 20 cm Tube secured with: Tape Dental Injury: Teeth and Oropharynx as per pre-operative assessment  Difficulty Due To: Difficulty was anticipated, Difficult Airway- due to anterior larynx and Difficult Airway- due to limited oral opening Future Recommendations: Recommend- induction with short-acting agent, and alternative techniques readily available

## 2019-05-10 NOTE — Anesthesia Preprocedure Evaluation (Addendum)
Anesthesia Evaluation  Patient identified by MRN, date of birth, ID band Patient awake    Reviewed: Allergy & Precautions, NPO status , Patient's Chart, lab work & pertinent test results  History of Anesthesia Complications (+) PONV and DIFFICULT AIRWAY  Airway Mallampati: III  TM Distance: <3 FB Neck ROM: Full    Dental no notable dental hx.    Pulmonary sleep apnea and Continuous Positive Airway Pressure Ventilation ,    Pulmonary exam normal breath sounds clear to auscultation       Cardiovascular hypertension, Pt. on medications Normal cardiovascular exam Rhythm:Regular Rate:Normal     Neuro/Psych negative neurological ROS  negative psych ROS   GI/Hepatic negative GI ROS, Neg liver ROS,   Endo/Other  Morbid obesity  Renal/GU negative Renal ROS  negative genitourinary   Musculoskeletal negative musculoskeletal ROS (+)   Abdominal (+) + obese,   Peds negative pediatric ROS (+)  Hematology negative hematology ROS (+)   Anesthesia Other Findings   Reproductive/Obstetrics negative OB ROS                             Anesthesia Physical Anesthesia Plan  ASA: III  Anesthesia Plan: General   Post-op Pain Management:    Induction: Intravenous and Rapid sequence  PONV Risk Score and Plan: 4 or greater and Ondansetron, Dexamethasone, Treatment may vary due to age or medical condition and Scopolamine patch - Pre-op  Airway Management Planned: Oral ETT and Video Laryngoscope Planned  Additional Equipment:   Intra-op Plan:   Post-operative Plan: Extubation in OR  Informed Consent: I have reviewed the patients History and Physical, chart, labs and discussed the procedure including the risks, benefits and alternatives for the proposed anesthesia with the patient or authorized representative who has indicated his/her understanding and acceptance.     Dental advisory given  Plan  Discussed with: CRNA and Surgeon  Anesthesia Plan Comments:        Anesthesia Quick Evaluation

## 2019-05-10 NOTE — H&P (Signed)
[name] is an 58 y.o. female.   Chief Complaint: Hernia HPI:  Pt is a 58 yo F with acute onset of abdominal pain this afternoon/evening that was generalized.  She had an episode of similar pain several weeks ago that resolved on its own. She has a known history of umbilical hernia (present for "years").  She was very nauseated at home, but did not throw up until my evaluation.  She has not had hernia surgery before.  She has had gastric sleeve 2019 with Dr. Ezzard StandingNewman. She has passed gas today several times, but none for last few hours.  She denies fever/chills.     Past Medical History:  Diagnosis Date  . Arthritis    knees, right shoulder  . Carpal tunnel syndrome of left wrist 04/2013  . Dental crowns present   . Depression   . Difficult intubation   . Esophageal stricture    hx. of esophageal dilation x 1  . GERD (gastroesophageal reflux disease)   . Gout   . History of difficult intubation hysterectomy   letter from 12-12-2008 on chart, 2 more surgeries since then no intubation problems  . History of kidney stones    x 2 passed on own  . Hypertension    under control with med., has been on med. x 5 yr.  . Muscle pain   . PONV (postoperative nausea and vomiting)    nausea  . Pre-diabetes    pt does not check cbg at home  . Sleep apnea    uses CPAP nightly set on 12.0  . Umbilical hernia     Past Surgical History:  Procedure Laterality Date  . BILATERAL SALPINGOOPHORECTOMY  12/11/2008  . CARPAL TUNNEL RELEASE Left 05/11/2013   Procedure: CARPAL TUNNEL RELEASE;  Surgeon: Wyn Forsterobert V Sypher Jr., MD;  Location: [facility];  Service: Orthopedics;  Laterality: Left;  . CHOLECYSTECTOMY  1985   open  . ESOPHAGEAL DILATION    . EXCISION MORTON'S NEUROMA Right 05/21/2014   Procedure: EXCISION MORTON'S NEUROMA;  Surgeon: Larey DresserMartha Ajlouny, DPM;  [facility];  Service: Podiatry;  Laterality: Right;  neurectomy  . HEEL SPUR RESECTION Right 05/21/2014    Procedure: HEEL SPUR RESECTION;  Surgeon: Larey DresserMartha Ajlouny, DPM;  [facility] [place];  Service: Podiatry;  Laterality: Right;  mid-foot bone resection   . HYSTEROSCOPY W/D&C  01/28/2001  . INCISION AND DRAINAGE PERIRECTAL ABSCESS  07/03/2011  . LAPAROSCOPIC GASTRIC SLEEVE RESECTION N/A 08/30/2018   Procedure: LAPAROSCOPIC GASTRIC SLEEVE RESECTION, Upper Endo, ERAS Pathway;  Surgeon: Ovidio KinNewman, David, MD;  Location: [facility];  Service: General;  Laterality: N/A;  . LAPAROSCOPIC SUPRACERVICAL HYSTERECTOMY  12/11/2008  . TONSILLECTOMY AND ADENOIDECTOMY  age 58    Family History  Problem Relation Age of Onset  . COPD Father   . Hypertension Other    Social History:  reports that she has never smoked. She has never used smokeless tobacco. She reports that she does not drink alcohol or use drugs.  Allergies:  Allergies  Allergen Reactions  . Aspirin Itching and Swelling  . Demerol [Meperidine] Nausea And Vomiting  . Nsaids Itching and Swelling  . Other Nausea Only    PERFUMES CAUSE HEADACHE  . Salicylates Itching and Swelling  . Dilaudid [Hydromorphone Hcl] Itching  . Iohexol Itching         Current Meds  Medication Sig  . acetaminophen (TYLENOL) 500 MG tablet Take 500 mg by mouth every 6 (six)  hours as needed for moderate pain.  Marland Kitchen. allopurinol (ZYLOPRIM) 100 MG tablet Take 300 mg by mouth daily.   . Calcium Carbonate-Vit D-Min (CALCIUM 1200) 1200-1000 MG-UNIT CHEW Chew 3 tablets by mouth daily.  . cholecalciferol (VITAMIN D3) 25 MCG (1000 UT) tablet Take 1,000 Units by mouth daily.  . cyclobenzaprine (FLEXERIL) 10 MG tablet Take 10 mg by mouth at bedtime.   . DULoxetine (CYMBALTA) 30 MG capsule Take 30 mg by mouth every evening.   Marland Kitchen. lisinopril (PRINIVIL,ZESTRIL) 20 MG tablet Take 20 mg by mouth daily.  . Multiple Vitamin (MULTIVITAMIN WITH MINERALS) TABS tablet Take 1 tablet by mouth daily.  Marland Kitchen. omeprazole (PRILOSEC) 20 MG capsule Take 20 mg by mouth daily.     Results for  orders placed or performed during the hospital encounter of 05/09/19 (from the past 48 hour(s))  Lipase, blood     Status: None   Collection Time: 05/09/19  4:20 PM  Result Value Ref Range   Lipase 25 11 - 51 U/L    Comment: Performed at [facility], [address] [place], [place] [address]  Comprehensive metabolic panel     Status: Abnormal   Collection Time: 05/09/19  4:20 PM  Result Value Ref Range   Sodium 141 135 - 145 mmol/L   Potassium 3.5 3.5 - 5.1 mmol/L   Chloride 105 98 - 111 mmol/L   CO2 25 22 - 32 mmol/L   Glucose, Bld 105 (H) 70 - 99 mg/dL   BUN 17 6 - 20 mg/dL   Creatinine, Ser 1.300.54 0.44 - 1.00 mg/dL   Calcium 86.510.0 8.9 - 78.410.3 mg/dL   Total Protein 7.8 6.5 - 8.1 g/dL   Albumin 4.5 3.5 - 5.0 g/dL   AST 20 15 - 41 U/L   ALT 13 0 - 44 U/L   Alkaline Phosphatase 67 38 - 126 U/L   Total Bilirubin 0.5 0.3 - 1.2 mg/dL   GFR calc non Af Amer >60 >60 mL/min   GFR calc Af Amer >60 >60 mL/min   Anion gap 11 5 - 15    Comment: Performed at [facility], [address] [place], [place] [address]  CBC     Status: Abnormal   Collection Time: 05/09/19  4:20 PM  Result Value Ref Range   WBC 13.2 (H) 4.0 - 10.5 K/uL   RBC 5.33 (H) 3.87 - 5.11 MIL/uL   Hemoglobin 15.3 (H) 12.0 - 15.0 g/dL   HCT 52.846.7 (H) 41.336.0 - 24.446.0 %   MCV 87.6 80.0 - 100.0 fL   MCH 28.7 26.0 - 34.0 pg   MCHC 32.8 30.0 - 36.0 g/dL   RDW 01.014.8 27.211.5 - 53.615.5 %   Platelets 314 150 - 400 K/uL   nRBC 0.0 0.0 - 0.2 %    Comment: Performed at [facility], [address] [place], [place] [address]  Urinalysis, Routine w reflex microscopic     Status: Abnormal   Collection Time: 05/09/19  5:28 PM  Result Value Ref Range   Color, Urine YELLOW YELLOW   APPearance HAZY (A) CLEAR   Specific Gravity, Urine 1.026 1.005 - 1.030   pH 7.0 5.0 - 8.0   Glucose, UA NEGATIVE NEGATIVE mg/dL   Hgb urine dipstick NEGATIVE NEGATIVE   Bilirubin Urine NEGATIVE NEGATIVE    Ketones, ur 80 (A) NEGATIVE mg/dL   Protein, ur NEGATIVE NEGATIVE mg/dL   Nitrite NEGATIVE NEGATIVE   Leukocytes,Ua SMALL (A) NEGATIVE  RBC / HPF 0-5 0 - 5 RBC/hpf   WBC, UA 6-10 0 - 5 WBC/hpf   Bacteria, UA NONE SEEN NONE SEEN   Squamous Epithelial / LPF 11-20 0 - 5   Mucus PRESENT     Comment: Performed at [facility], [address] [place], [place] [address]  Lactic acid, plasma     Status: None   Collection Time: 05/09/19 10:16 PM  Result Value Ref Range   Lactic Acid, Venous 0.8 0.5 - 1.9 mmol/L    Comment: Performed at [facility], [address] [place], [place] [address]   Ct Abdomen Pelvis W Contrast  Result Date: 05/09/2019 CLINICAL DATA:  Acute generalized abdominal pain. EXAM: CT ABDOMEN AND PELVIS WITH CONTRAST TECHNIQUE: Multidetector CT imaging of the abdomen and pelvis was performed using the standard protocol following bolus administration of intravenous contrast. CONTRAST:  100mL OMNIPAQUE IOHEXOL 300 MG/ML  SOLN COMPARISON:  04/11/2014. FINDINGS: Lower chest: No acute abnormality. Hepatobiliary: No focal liver abnormality is seen. Status post cholecystectomy. No biliary dilatation. There is probable underlying hepatic steatosis. Pancreas: Unremarkable. No pancreatic ductal dilatation or surrounding inflammatory changes. Spleen: The spleen is significantly enlarged measuring approximately 17.9 cm craniocaudad. Adrenals/Urinary Tract: Adrenal glands are unremarkable. Kidneys are normal, without renal calculi, focal lesion, or hydronephrosis. Bladder is unremarkable. Stomach/Bowel: There are scattered colonic diverticula without CT evidence of diverticulitis. The appendix is not reliably identified. The patient is status post prior sleeve gastrectomy. There is a small bowel obstruction with a transition point at the level of a small bowel containing ventral wall hernia. There is a small amount of free fluid and fat stranding within the  hernia sac. The small bowel proximal to the hernia is dilated with air-fluid levels measuring up to approximately 3.5 cm. The small bowel distal to the hernia is decompressed. There is no evidence of pneumatosis or free air. Vascular/Lymphatic: Aortic atherosclerosis. No enlarged abdominal or pelvic lymph nodes. Reproductive: Status post hysterectomy. No adnexal masses. Other: No significant free fluid or free air within the abdomen or pelvis. Musculoskeletal: There is a unilateral pars defect at L5. There is no displaced fracture. There is a benign-appearing sclerotic lesion in the proximal partially visualized left femur. IMPRESSION: 1. Moderate grade small bowel obstruction secondary to a small bowel containing ventral wall hernia. There is a small amount of free fluid within the hernia sac. Correlation for signs and symptoms of incarceration or strangulation is recommended. 2. Status post sleeve gastrectomy and cholecystectomy. 3. Splenomegaly. 4. Hepatomegaly with hepatic steatosis. Electronically Signed   By: Katherine Mantlehristopher  Green M.D.   On: 05/09/2019 21:52    Review of Systems  Constitutional: Negative.   HENT: Negative.   Eyes: Negative.   Respiratory: Negative.   Cardiovascular: Negative.   Gastrointestinal: Positive for abdominal pain, nausea and vomiting.  Genitourinary: Negative.   Musculoskeletal: Negative.   Skin: Negative.   Neurological: Negative.   Endo/Heme/Allergies: Negative.   Psychiatric/Behavioral: Negative.   All other systems reviewed and are negative.   Blood pressure (!) 143/89, pulse 66, temperature 98.5 F (36.9 C), temperature source Oral, resp. rate 15, height 5\' 1"  (1.549 m), weight 107.3 kg, SpO2 99 %. Physical Exam  Constitutional: She is oriented to person, place, and time. She appears well-developed and well-nourished. She appears distressed.  HENT:  Head: Normocephalic and atraumatic.  Mouth/Throat: Oropharynx is clear and moist.  Eyes: Pupils are equal,  round, and reactive to light. Conjunctivae are normal. Right eye exhibits no  discharge. Left eye exhibits no discharge. Scleral icterus is present.  Neck: Neck supple. No tracheal deviation present. No thyromegaly present.  Cardiovascular: Normal rate, regular rhythm and intact distal pulses.  Respiratory: No respiratory distress. She exhibits no tenderness.  GI: Soft. She exhibits no distension and no mass. There is abdominal tenderness (tender wtih palpation at hernia). There is no rebound and no guarding.  Musculoskeletal: Normal range of motion.        General: No tenderness, deformity or edema.  Lymphadenopathy:    She has no cervical adenopathy.  Neurological: She is alert and oriented to person, place, and time. Coordination normal.  Skin: Skin is warm and dry. No rash noted. She is not diaphoretic. No erythema. No pallor.  Psychiatric: She has a normal mood and affect. Her behavior is normal. Judgment and thought content normal.     Assessment/Plan Umbilical hernia/Incisional hernia with incarceration and obstruction It felt as though I was able to reduce it at least partially.   Will need NGT and operative repair.    IV fluids NPO Will post for  Dr. Dalbert Batman.    Stark Klein, MD 05/10/2019, 12:50 AM

## 2019-05-10 NOTE — Progress Notes (Addendum)
Subjective: Alert and more comfortable.  NG tube in place. Vital signs are normal and stable. Lab work shows WBC 11,400.  Hemoglobin 13.7.  Potassium 3.7.  Glucose 124.  Creatinine 0.47.  She understands that she will need surgery today for repair of incarcerated periumbilical hernia with small bowel obstruction  Objective: Vital signs in last 24 hours: Temp:  [98.1 F (36.7 C)-98.7 F (37.1 C)] 98.1 F (36.7 C) (06/10 0559) Pulse Rate:  [64-70] 66 (06/10 0559) Resp:  [15-20] 16 (06/10 0559) BP: (122-156)/(74-97) 122/78 (06/10 0559) SpO2:  [86 %-100 %] 93 % (06/10 0559) Weight:  [102.5 kg-107.3 kg] 102.5 kg (06/10 0339) Last BM Date: 05/09/19  Intake/Output from previous day: 06/09 0701 - 06/10 0700 In: 0  Out: 500 [Emesis/NG output:500] Intake/Output this shift: Total I/O In: 0  Out: 500 [Emesis/NG output:500]  General appearance: Alert.  Pleasant.  Cooperative.  Mental status normal.  Good insight.  Minimally distressed Resp: clear to auscultation bilaterally GI: Obese.  Soft.  She does not seem distended.  I can feel a small mass deep to the umbilicus.  The skin is healthy.  The abdomen is soft and nontender elsewhere without any guarding or rebound. Extremities: extremities normal, atraumatic, no cyanosis or edema  Lab Results:  Recent Labs    05/09/19 1620 05/10/19 0457  WBC 13.2* 11.4*  HGB 15.3* 13.7  HCT 46.7* 43.5  PLT 314 248   BMET Recent Labs    05/09/19 1620 05/10/19 0457  NA 141 142  K 3.5 3.7  CL 105 109  CO2 25 25  GLUCOSE 105* 124*  BUN 17 11  CREATININE 0.54 0.47  CALCIUM 10.0 9.0   PT/INR No results for input(s): LABPROT, INR in the last 72 hours. ABG No results for input(s): PHART, HCO3 in the last 72 hours.  Invalid input(s): PCO2, PO2  Studies/Results: Ct Abdomen Pelvis W Contrast  Result Date: 05/09/2019 CLINICAL DATA:  Acute generalized abdominal pain. EXAM: CT ABDOMEN AND PELVIS WITH CONTRAST TECHNIQUE: Multidetector CT  imaging of the abdomen and pelvis was performed using the standard protocol following bolus administration of intravenous contrast. CONTRAST:  111mL OMNIPAQUE IOHEXOL 300 MG/ML  SOLN COMPARISON:  04/11/2014. FINDINGS: Lower chest: No acute abnormality. Hepatobiliary: No focal liver abnormality is seen. Status post cholecystectomy. No biliary dilatation. There is probable underlying hepatic steatosis. Pancreas: Unremarkable. No pancreatic ductal dilatation or surrounding inflammatory changes. Spleen: The spleen is significantly enlarged measuring approximately 17.9 cm craniocaudad. Adrenals/Urinary Tract: Adrenal glands are unremarkable. Kidneys are normal, without renal calculi, focal lesion, or hydronephrosis. Bladder is unremarkable. Stomach/Bowel: There are scattered colonic diverticula without CT evidence of diverticulitis. The appendix is not reliably identified. The patient is status post prior sleeve gastrectomy. There is a small bowel obstruction with a transition point at the level of a small bowel containing ventral wall hernia. There is a small amount of free fluid and fat stranding within the hernia sac. The small bowel proximal to the hernia is dilated with air-fluid levels measuring up to approximately 3.5 cm. The small bowel distal to the hernia is decompressed. There is no evidence of pneumatosis or free air. Vascular/Lymphatic: Aortic atherosclerosis. No enlarged abdominal or pelvic lymph nodes. Reproductive: Status post hysterectomy. No adnexal masses. Other: No significant free fluid or free air within the abdomen or pelvis. Musculoskeletal: There is a unilateral pars defect at L5. There is no displaced fracture. There is a benign-appearing sclerotic lesion in the proximal partially visualized left femur. IMPRESSION: 1. Moderate grade  small bowel obstruction secondary to a small bowel containing ventral wall hernia. There is a small amount of free fluid within the hernia sac. Correlation for signs  and symptoms of incarceration or strangulation is recommended. 2. Status post sleeve gastrectomy and cholecystectomy. 3. Splenomegaly. 4. Hepatomegaly with hepatic steatosis. Electronically Signed   By: Katherine Mantlehristopher  Green M.D.   On: 05/09/2019 21:52   Dg Abd Portable 1 View  Result Date: 05/10/2019 CLINICAL DATA:  NG tube placement EXAM: PORTABLE ABDOMEN - 1 VIEW COMPARISON:  CT from the same day. FINDINGS: The enteric tube projects over the gastric antrum. Dilated loops of small bowel are again noted. Contrast is noted within the urinary bladder. IMPRESSION: NG tube tip projects over the gastric antrum. Electronically Signed   By: Katherine Mantlehristopher  Green M.D.   On: 05/10/2019 02:29    Anti-infectives: Anti-infectives (From admission, onward)   Start     Dose/Rate Route Frequency Ordered Stop   05/10/19 0600  cefoTEtan (CEFOTAN) 2 g in sodium chloride 0.9 % 100 mL IVPB     2 g 200 mL/hr over 30 Minutes Intravenous On call to O.R. 05/10/19 0324 05/11/19 0559   05/09/19 2200  piperacillin-tazobactam (ZOSYN) IVPB 3.375 g     3.375 g 100 mL/hr over 30 Minutes Intravenous  Once 05/09/19 2159 05/09/19 2248      Assessment/Plan: s/p Procedure(s): HERNIA REPAIR UMBILICAL ADULT AND POSSIBLE SMALL BOWEL RESECTION   Incarcerated umbilical hernia/incisional hernia with small bowel obstruction -Compromised bowel seems less likely but always possible -Plan operative repair today, mid day. -I described the indications, incision, details, techniques, and risk with her in detail.  She is aware of the risk of bleeding, infection, bowel resection, rarely ostomy, recurrence of the hernia, cardiac pulmonary and thromboembolic problems.  She knows that we will not use mesh in this setting.  She understands all of these issues.  All of her questions were answered.  She agrees with this plan.  History sleeve gastrectomy History laparoscopic supracervical hysterectomy. -Small incision at umbilicus may have  contributed to hernia formation History cholecystectomy History BSO Sleep apnea Hypertension History of esophageal stricture with dilatation Gout  Contact:  Lenice LlamasSteven Lariccia (husband) - [phone]   LOS: 0 days    Ernestene MentionHaywood M Delanee Xin 05/10/2019

## 2019-05-11 ENCOUNTER — Encounter (HOSPITAL_COMMUNITY): Payer: Self-pay | Admitting: General Surgery

## 2019-05-11 LAB — CBC
HCT: 38.3 % (ref 36.0–46.0)
Hemoglobin: 12.3 g/dL (ref 12.0–15.0)
MCH: 29.1 pg (ref 26.0–34.0)
MCHC: 32.1 g/dL (ref 30.0–36.0)
MCV: 90.5 fL (ref 80.0–100.0)
Platelets: 230 10*3/uL (ref 150–400)
RBC: 4.23 MIL/uL (ref 3.87–5.11)
RDW: 14.9 % (ref 11.5–15.5)
WBC: 10.8 10*3/uL — ABNORMAL HIGH (ref 4.0–10.5)
nRBC: 0 % (ref 0.0–0.2)

## 2019-05-11 LAB — BASIC METABOLIC PANEL
Anion gap: 9 (ref 5–15)
BUN: 11 mg/dL (ref 6–20)
CO2: 29 mmol/L (ref 22–32)
Calcium: 9.1 mg/dL (ref 8.9–10.3)
Chloride: 102 mmol/L (ref 98–111)
Creatinine, Ser: 0.5 mg/dL (ref 0.44–1.00)
GFR calc Af Amer: >60 mL/min (ref >60)
GFR calc non Af Amer: >60 mL/min (ref >60)
Glucose, Bld: 101 mg/dL — ABNORMAL HIGH (ref 70–99)
Potassium: 3.8 mmol/L (ref 3.5–5.1)
Sodium: 140 mmol/L (ref 135–145)

## 2019-05-11 MED ORDER — ACETAMINOPHEN 325 MG PO TABS
650.0000 mg | ORAL_TABLET | Freq: Four times a day (QID) | ORAL | Status: DC | PRN
  Administered 2019-05-11 – 2019-05-12 (×3): 650 mg via ORAL
  Filled 2019-05-11 (×3): qty 2

## 2019-05-11 NOTE — Progress Notes (Signed)
[facility] Progress Note  1 Day Post-Op  Subjective: CC-  Up in chair. RN states that she has already walked about 20 laps. States that her abdomen is sore but feels much better than prior to surgery. Denies bloating, nausea, or vomiting. 700cc out from NG tube. No nausea when NG tube is clamped for ambulating in the halls. She is passing small amount of flatus. No BM.  Objective: Vital signs in last 24 hours: Temp:  [97.6 F (36.4 C)-99.7 F (37.6 C)] 97.9 F (36.6 C) (06/11 0910) Pulse Rate:  [61-73] 67 (06/11 0910) Resp:  [12-18] 18 (06/11 0910) BP: (112-152)/(78-90) 115/78 (06/11 0910) SpO2:  [91 %-100 %] 100 % (06/11 0910) Last BM Date: 05/09/19  Intake/Output from previous day: 06/10 0701 - 06/11 0700 In: 2458.8 [I.V.:2258.8; IV Piggyback:200] Out: 1815 [Urine:1050; Emesis/NG output:700; Drains:40; Blood:25] Intake/Output this shift: Total I/O In: 0  Out: 50 [Emesis/NG output:50]  PE: Gen:  Alert, NAD, pleasant HEENT: EOM's intact, pupils equal and round Pulm:  effort normal Abd: obese, soft, mild tenderness around incision, +BS, incision cdi with staples intact, JP drain with serosanguinous output Ext: 1+ BLE edema Psych: A&Ox3  Skin: no rashes noted, warm and dry  Lab Results:  Recent Labs    05/10/19 0457 05/11/19 0933  WBC 11.4* 10.8*  HGB 13.7 12.3  HCT 43.5 38.3  PLT 248 230   BMET Recent Labs    05/10/19 0457 05/11/19 0933  NA 142 140  K 3.7 3.8  CL 109 102  CO2 25 29  GLUCOSE 124* 101*  BUN 11 11  CREATININE 0.47 0.50  CALCIUM 9.0 9.1   PT/INR No results for input(s): LABPROT, INR in the last 72 hours. CMP     Component Value Date/Time   NA 140 05/11/2019 0933   K 3.8 05/11/2019 0933   CL 102 05/11/2019 0933   CO2 29 05/11/2019 0933   GLUCOSE 101 (H) 05/11/2019 0933   BUN 11 05/11/2019 0933   CREATININE 0.50 05/11/2019 0933   CALCIUM 9.1 05/11/2019 0933   PROT 7.8 05/09/2019 1620   ALBUMIN 4.5 05/09/2019 1620    AST 20 05/09/2019 1620   ALT 13 05/09/2019 1620   ALKPHOS 67 05/09/2019 1620   BILITOT 0.5 05/09/2019 1620   GFRNONAA >60 05/11/2019 0933   GFRAA >60 05/11/2019 0933   Lipase     Component Value Date/Time   LIPASE 25 05/09/2019 1620       Studies/Results: Ct Abdomen Pelvis W Contrast  Result Date: 05/09/2019 CLINICAL DATA:  Acute generalized abdominal pain. EXAM: CT ABDOMEN AND PELVIS WITH CONTRAST TECHNIQUE: Multidetector CT imaging of the abdomen and pelvis was performed using the standard protocol following bolus administration of intravenous contrast. CONTRAST:  100mL OMNIPAQUE IOHEXOL 300 MG/ML  SOLN COMPARISON:  04/11/2014. FINDINGS: Lower chest: No acute abnormality. Hepatobiliary: No focal liver abnormality is seen. Status post cholecystectomy. No biliary dilatation. There is probable underlying hepatic steatosis. Pancreas: Unremarkable. No pancreatic ductal dilatation or surrounding inflammatory changes. Spleen: The spleen is significantly enlarged measuring approximately 17.9 cm craniocaudad. Adrenals/Urinary Tract: Adrenal glands are unremarkable. Kidneys are normal, without renal calculi, focal lesion, or hydronephrosis. Bladder is unremarkable. Stomach/Bowel: There are scattered colonic diverticula without CT evidence of diverticulitis. The appendix is not reliably identified. The patient is status post prior sleeve gastrectomy. There is a small bowel obstruction with a transition point at the level of a small bowel containing ventral wall hernia. There is a small amount of free fluid  and fat stranding within the hernia sac. The small bowel proximal to the hernia is dilated with air-fluid levels measuring up to approximately 3.5 cm. The small bowel distal to the hernia is decompressed. There is no evidence of pneumatosis or free air. Vascular/Lymphatic: Aortic atherosclerosis. No enlarged abdominal or pelvic lymph nodes. Reproductive: Status post hysterectomy. No adnexal masses.  Other: No significant free fluid or free air within the abdomen or pelvis. Musculoskeletal: There is a unilateral pars defect at L5. There is no displaced fracture. There is a benign-appearing sclerotic lesion in the proximal partially visualized left femur. IMPRESSION: 1. Moderate grade small bowel obstruction secondary to a small bowel containing ventral wall hernia. There is a small amount of free fluid within the hernia sac. Correlation for signs and symptoms of incarceration or strangulation is recommended. 2. Status post sleeve gastrectomy and cholecystectomy. 3. Splenomegaly. 4. Hepatomegaly with hepatic steatosis. Electronically Signed   By: Constance Holster M.D.   On: 05/09/2019 21:52   Dg Abd Portable 1 View  Result Date: 05/10/2019 CLINICAL DATA:  NG tube placement EXAM: PORTABLE ABDOMEN - 1 VIEW COMPARISON:  CT from the same day. FINDINGS: The enteric tube projects over the gastric antrum. Dilated loops of small bowel are again noted. Contrast is noted within the urinary bladder. IMPRESSION: NG tube tip projects over the gastric antrum. Electronically Signed   By: Constance Holster M.D.   On: 05/10/2019 02:29    Anti-infectives: Anti-infectives (From admission, onward)   Start     Dose/Rate Route Frequency Ordered Stop   05/10/19 2000  ceFAZolin (ANCEF) IVPB 2g/100 mL premix     2 g 200 mL/hr over 30 Minutes Intravenous Every 8 hours 05/10/19 1724 05/10/19 2045   05/10/19 0600  cefoTEtan (CEFOTAN) 2 g in sodium chloride 0.9 % 100 mL IVPB     2 g 200 mL/hr over 30 Minutes Intravenous On call to O.R. 05/10/19 0324 05/10/19 1515   05/09/19 2200  piperacillin-tazobactam (ZOSYN) IVPB 3.375 g     3.375 g 100 mL/hr over 30 Minutes Intravenous  Once 05/09/19 2159 05/09/19 2248       Assessment/Plan History sleeve gastrectomy History laparoscopic supracervical hysterectomy. -Small incision at umbilicus may have contributed to hernia formation History cholecystectomy History  BSO Sleep apnea Hypertension History of esophageal stricture with dilatation Gout  Incarcerated incisional hernia with small bowel obstruction S/p Open repair of incarcerated incisional hernia, partial omentectomy, resection of umbilicus and skin revision 6/10 Dr. Dalbert Batman - POD 1 - WBC down 10.8, VSS - passing some flatus  ID - perioperative abx, none currently FEN - IVF, NGT clamped, CLD VTE - SCDs, lovenox Foley - none Follow up - Dr. Dalbert Batman Contact:  Keyleen Cerrato (husband) - [phone]  Plan - Clamp NG tube and allow clear liquids. If tolerating clears without increased abdominal pain or nausea will d/c NG tube later today. Continue ambulating.    LOS: 1 day    Wellington Hampshire , [facility] 05/11/2019, 10:08 AM Pager: [phone] Mon-Thurs 7:00 am-4:30 pm Fri 7:00 am -11:30 AM Sat-Sun 7:00 am-11:30 am

## 2019-05-11 NOTE — Discharge Instructions (Signed)
[facility] _______[facility]  HERNIA REPAIR: POST OP INSTRUCTIONS  Always review your discharge instruction sheet given to you by the facility where your surgery was performed. IF YOU HAVE DISABILITY OR FAMILY LEAVE FORMS, YOU MUST BRING THEM TO THE OFFICE FOR PROCESSING.   DO NOT GIVE THEM TO YOUR DOCTOR.  1. A  prescription for pain medication may be given to you upon discharge.  Take your pain medication as prescribed, if needed.  If narcotic pain medicine is not needed, then you may take acetaminophen (Tylenol) or ibuprofen (Advil) as needed. 2. Take your usually prescribed medications unless otherwise directed. If you need a refill on your pain medication, please contact your pharmacy.  They will contact our office to request authorization. Prescriptions will not be filled after 5 pm or on week-ends. 3. You should follow a light diet the first 24 hours after arrival home, such as soup and crackers, etc.  Be sure to include lots of fluids daily.  Resume your normal diet the day after surgery. 4.Most patients will experience some swelling and bruising around the umbilicus or in the groin and scrotum.  Ice packs and reclining will help.  Swelling and bruising can take several days to resolve.  6. It is common to experience some constipation if taking pain medication after surgery.  Increasing fluid intake and taking a stool softener (such as Colace) will usually help or prevent this problem from occurring.  A mild laxative (Milk of Magnesia or Miralax) should be taken according to package directions if there are no bowel movements after 48 hours. 7. Staples will be removed at the office during your follow-up visit. You may shower. 8. ACTIVITIES:  You may resume regular (light) daily activities beginning the next day--such as daily self-care, walking, climbing stairs--gradually increasing activities as tolerated.  You may have sexual intercourse when it is comfortable.  Refrain from any  heavy lifting or straining until approved by your doctor.  a.You may drive when you are no longer taking prescription pain medication, you can comfortably wear a seatbelt, and you can safely maneuver your car and apply brakes.  9.You should see your doctor in the office for a follow-up appointment approximately 2-3 weeks after your surgery.  Make sure that you call for this appointment within a day or two after you arrive home to insure a convenient appointment time. 10.OTHER INSTRUCTIONS: _________________________    _____________________________________  WHEN TO CALL YOUR DOCTOR: 1. Fever over 101.0 2. Inability to urinate 3. Nausea and/or vomiting 4. Extreme swelling or bruising 5. Continued bleeding from incision. 6. Increased pain, redness, or drainage from the incision  The clinic staff is available to answer your questions during regular business hours.  Please dont hesitate to call and ask to speak to one of the nurses for clinical concerns.  If you have a medical emergency, go to the nearest emergency room or call 911.  A surgeon from [facility] is always on call at the hospital   [address], [place], [place], [place]  [address] ?  [address], [place], [place]   [address] [phone] ? [phone] ? FAX [phone] Web site: [email or web address]

## 2019-05-12 MED ORDER — PHENOL 1.4 % MT LIQD
1.0000 | OROMUCOSAL | Status: DC | PRN
  Filled 2019-05-12: qty 177

## 2019-05-12 MED ORDER — MENTHOL 3 MG MT LOZG
1.0000 | LOZENGE | OROMUCOSAL | Status: DC | PRN
  Filled 2019-05-12: qty 9

## 2019-05-12 MED ORDER — HYDROCODONE-ACETAMINOPHEN 5-325 MG PO TABS: 1.0000 | ORAL_TABLET | Freq: Four times a day (QID) | ORAL | 0 refills | Status: DC | PRN

## 2019-05-12 NOTE — Progress Notes (Addendum)
2 Days Post-Op    CC: Incarcerated umbilical hernia.  Subjective: Patient was upset over clear liquids because it had a lot of sugar in it.  We switched her over to a bariatric full liquids.  Her incision site looks fine.  The drain is serous sanguinous.  She wants to take a shower.  She is passing flatus but no BM so far.  Objective: Vital signs in last 24 hours: Temp:  [97.8 F (36.6 C)-98.8 F (37.1 C)] 98.8 F (37.1 C) (06/12 0513) Pulse Rate:  [57-70] 62 (06/12 0513) Resp:  [16-18] 16 (06/12 0513) BP: (98-119)/(61-90) 119/70 (06/12 0513) SpO2:  [94 %-100 %] 94 % (06/11 2248) Last BM Date: 05/09/19 1020 p.o. 1600 IV Emesis 50 Drain 52  No BM Afebrile vital signs are stable No labs today.  Intake/Output from previous day: 06/11 0701 - 06/12 0700 In: 2643.1 [P.O.:1020; I.V.:1623.1] Out: 2452 [Urine:2350; Emesis/NG output:50; Drains:52] Intake/Output this shift: No intake/output data recorded.  General appearance: alert, cooperative and no distress Resp: clear to auscultation bilaterally GI: Abdomen is soft midline incision looks good she has staples in place.  Drain is serosanguineous.  She is passing flatus.  No BM so far.  She is just started a full liquid diet.  Lab Results:  Recent Labs    05/10/19 0457 05/11/19 0933  WBC 11.4* 10.8*  HGB 13.7 12.3  HCT 43.5 38.3  PLT 248 230    BMET Recent Labs    05/10/19 0457 05/11/19 0933  NA 142 140  K 3.7 3.8  CL 109 102  CO2 25 29  GLUCOSE 124* 101*  BUN 11 11  CREATININE 0.47 0.50  CALCIUM 9.0 9.1   PT/INR No results for input(s): LABPROT, INR in the last 72 hours.  Recent Labs  Lab 05/09/19 1620  AST 20  ALT 13  ALKPHOS 67  BILITOT 0.5  PROT 7.8  ALBUMIN 4.5     Lipase     Component Value Date/Time   LIPASE 25 05/09/2019 1620     Medications: . enoxaparin (LOVENOX) injection  40 mg Subcutaneous Q24H  . pantoprazole (PROTONIX) IV  40 mg Intravenous QHS  . sodium chloride flush  3 mL  Intravenous Once    Assessment/Plan History sleeve gastrectomy History laparoscopic supracervical hysterectomy. -Small incision at umbilicus may have contributed to hernia formation History cholecystectomy History BSO Sleep apnea Hypertension History of esophageal stricture with dilatation Gout  Incarcerated incisional hernia with small bowel obstruction  S/p Open repair of incarcerated incisional hernia, partial omentectomy, resection of umbilicus and skin revision - 6/10 - Dr. Derrell LollingIngram  - POD 2  - WBC down 10.8, VSS  - passing some flatus  ID - perioperative abx, none currently FEN - IVF, NGT clamped, CLD VTE - SCDs, lovenox Foley - none Follow up - Dr. Derrell LollingIngram Contact: Lenice LlamasSteven Pankow (husband) - [phone]   Plan: She asked if she can take a shower.  She says she is drinking a lot and ask about the IV fluids.    She is anticipating going home tomorrow.  See how she does with the full liquids, and let her shower.  Check on removing the drain here or in the office next week when she has her staples removed.  LOS: 2 days    JENNINGS,WILLARD 05/12/2019 [phone]  Agree with above. Progressing well.  Home this afternoon. We talked about the coronavirus and reopening a church. She will go home with the drain and see us in the office next  week. She's done well with her weight loss and understands her diet.  Alphonsa Overall, MD, [facility] Pager: [phone] Office phone:  [phone]

## 2019-05-12 NOTE — Progress Notes (Signed)
Pt alert, oriented, tolerating diet.  Pain controlled.  D/C instructions given, all questions answered. D/Cd home.

## 2019-05-12 NOTE — Discharge Summary (Addendum)
[facility] Discharge Summary   Patient ID: [name] MRN: [id number] DOB/AGE: [birth year] 58 y.o.  Admit date: 05/09/2019 Discharge date: 05/12/2019  Admitting Diagnosis: Umbilical hernia/Incisional hernia with incarceration and obstruction  Discharge Diagnosis Patient Active Problem List   Diagnosis Date Noted  . Incarcerated incisional hernia s/p omentectomy & primary repair 05/10/2019 05/10/2019  . SBO (small bowel obstruction) ([place]) 05/10/2019  . Sleep apnea   . Pre-diabetes   . Hypertension   . Gout   . GERD (gastroesophageal reflux disease)   . Difficult intubation   . Morbid obesity with BMI of 50.0-59.9, adult ([place]) 08/30/2018  . Hidradenitis axillaris 09/18/2013    Consultants None  Imaging: No results found.  Procedures Dr. Dalbert Batman (05/10/19) - Open repair of incarcerated incisional hernia, partial omentectomy, resection of umbilicus and skin revision  Hospital Course:  VENESA SEMIDEY is a 58yo female h/o gastric sleeve 2019 by Dr. Lucia Gaskins, who presented to [facility]10 with acute onset abdominal pain.  Workup showed incarcerated incisional hernia.  Patient was admitted and underwent procedure listed above.  Tolerated procedure well and was transferred to the floor.  Diet was advanced as tolerated.  On POD2 the patient was voiding well, tolerating diet, ambulating well, pain well controlled, vital signs stable, incisions c/d/i and felt stable for discharge home. Patient will follow up as below and knows to call with questions or concerns.    I have personally reviewed the patients medication history on the [place] controlled substance database.    Allergies as of 05/12/2019      Reactions   Aspirin Itching, Swelling   Demerol [meperidine] Nausea And Vomiting   Nsaids Itching, Swelling   Other Nausea Only   PERFUMES CAUSE HEADACHE   Salicylates Itching, Swelling   Dilaudid [hydromorphone Hcl] Itching   Iohexol Itching         Medication List    TAKE  these medications   allopurinol 100 MG tablet Commonly known as: ZYLOPRIM Take 300 mg by mouth daily.   Calcium 1200 1200-1000 MG-UNIT Chew Chew 3 tablets by mouth daily.   cholecalciferol 25 MCG (1000 UT) tablet Commonly known as: VITAMIN D3 Take 1,000 Units by mouth daily.   clindamycin 1 % gel Commonly known as: CLINDAGEL Apply 1 application topically daily. Hidradenitis suppurativa   clindamycin 1 % external solution Commonly known as: CLEOCIN T Apply 1 application topically daily as needed. Hidradenitis suppurativa   cyclobenzaprine 10 MG tablet Commonly known as: FLEXERIL Take 10 mg by mouth at bedtime.   DULoxetine 30 MG capsule Commonly known as: CYMBALTA Take 30 mg by mouth every evening.   furosemide 20 MG tablet Commonly known as: LASIX Take 20 mg by mouth daily as needed for fluid.   HYDROcodone-acetaminophen 5-325 MG tablet Commonly known as: NORCO/VICODIN Take 1 tablet by mouth every 6 (six) hours as needed for severe pain.   lisinopril 20 MG tablet Commonly known as: ZESTRIL Take 20 mg by mouth daily.   multivitamin with minerals Tabs tablet Take 1 tablet by mouth daily.   omeprazole 20 MG capsule Commonly known as: PRILOSEC Take 20 mg by mouth daily.   Tylenol 8 Hour Arthritis Pain 650 MG CR tablet Generic drug: acetaminophen Take 650-1,300 mg by mouth every 8 (eight) hours as needed for pain.   acetaminophen 500 MG tablet Commonly known as: TYLENOL Take 500 mg by mouth every 6 (six) hours as needed for moderate pain.        Follow-up Information  [facility] Follow up on 05/22/2019.   Specialty: General Surgery Why: Your appointment is 6/22 at 10:30am with one of our nurses to have your staples removed and drain checked. Please arrive 15 minutes prior to your appointment to check in and fill out paperwork. Bring photo ID and insurance information. Contact information: [address] [place]  [place] [id number] [phone]       Claud KelpIngram, [place], MD. Nyra CapesGo on 06/06/2019.   Specialty: General Surgery Why: Your appointment is 7/7 at 2pm with Dr. Derrell LollingIngram. Please arrive 15 minutes early to check in. Contact information: [address] [place] [address] [phone]           Signed: Franne FortsBrooke A Meuth, [facility] 05/12/2019, 1:54 PM Pager: [phone]  Agree with above. She looks good.  Ovidio Kinavid Azaylia Fong, MD, [facility] Pager: [phone] Office phone:  [phone]

## 2019-06-21 DIAGNOSIS — I10 Essential (primary) hypertension: Secondary | ICD-10-CM | POA: Diagnosis not present

## 2019-06-21 DIAGNOSIS — M109 Gout, unspecified: Secondary | ICD-10-CM | POA: Diagnosis not present

## 2019-06-21 DIAGNOSIS — E559 Vitamin D deficiency, unspecified: Secondary | ICD-10-CM | POA: Diagnosis not present

## 2019-06-21 DIAGNOSIS — E782 Mixed hyperlipidemia: Secondary | ICD-10-CM | POA: Diagnosis not present

## 2019-06-21 DIAGNOSIS — R7303 Prediabetes: Secondary | ICD-10-CM | POA: Diagnosis not present

## 2019-06-26 DIAGNOSIS — E559 Vitamin D deficiency, unspecified: Secondary | ICD-10-CM | POA: Diagnosis not present

## 2019-06-26 DIAGNOSIS — F339 Major depressive disorder, recurrent, unspecified: Secondary | ICD-10-CM | POA: Diagnosis not present

## 2019-06-26 DIAGNOSIS — E782 Mixed hyperlipidemia: Secondary | ICD-10-CM | POA: Diagnosis not present

## 2019-06-26 DIAGNOSIS — I10 Essential (primary) hypertension: Secondary | ICD-10-CM | POA: Diagnosis not present

## 2019-08-11 DIAGNOSIS — G4733 Obstructive sleep apnea (adult) (pediatric): Secondary | ICD-10-CM | POA: Diagnosis not present

## 2019-08-15 DIAGNOSIS — B354 Tinea corporis: Secondary | ICD-10-CM | POA: Diagnosis not present

## 2019-09-26 ENCOUNTER — Other Ambulatory Visit: Payer: Self-pay

## 2019-09-26 DIAGNOSIS — Z20822 Contact with and (suspected) exposure to covid-19: Secondary | ICD-10-CM

## 2019-09-27 LAB — NOVEL CORONAVIRUS, NAA: SARS-CoV-2, NAA: NOT DETECTED

## 2019-09-27 IMAGING — CT CT ABDOMEN AND PELVIS WITH CONTRAST
2 of 5 series · 16 of 46 positions shown, 18 images · IV contrast (ISOVUE)
Comparison: 04/11/2014.

CLINICAL DATA: Acute generalized abdominal pain.

EXAM:
CT ABDOMEN AND PELVIS WITH CONTRAST
TECHNIQUE: Multidetector CT imaging of the abdomen and pelvis was performed
using the standard protocol following bolus administration of
intravenous contrast.
CONTRAST:  100mL OMNIPAQUE IOHEXOL 300 MG/ML  SOLN

[Series 2: axial st · axial · 0.78mm/px · z∈[+1176,+1536]mm · 13 of 85 slices shown, 15 images]
[im 7/85  soft-tissue]
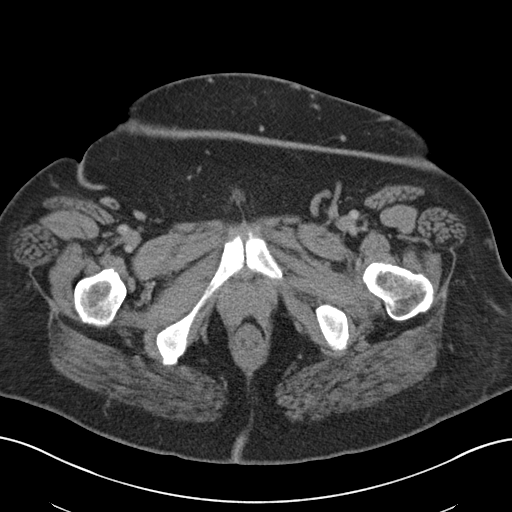
[im 7/85  bone]
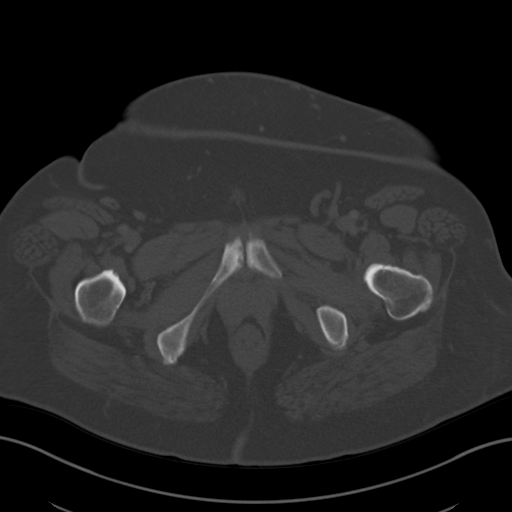
[im 13/85  soft-tissue]
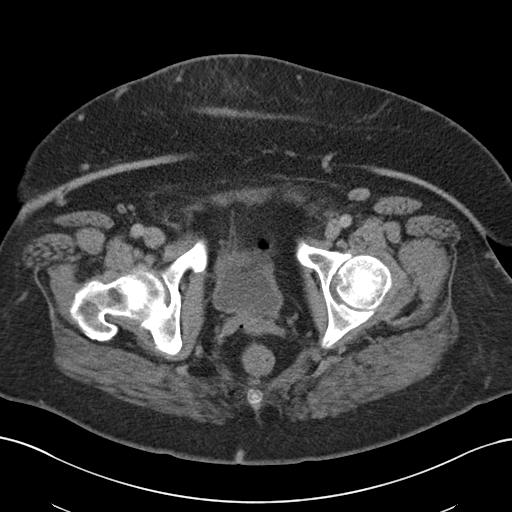
[im 19/85  soft-tissue]
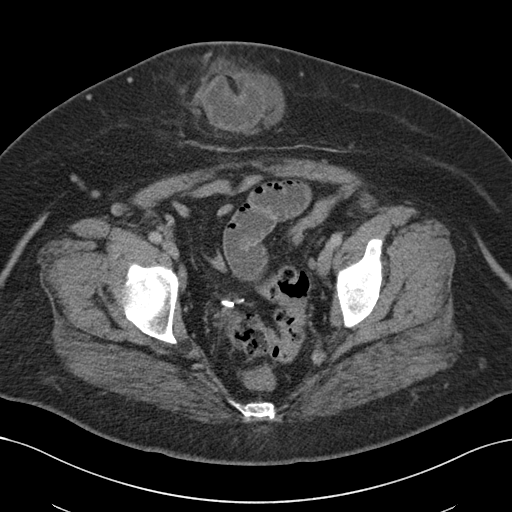
[im 25/85  soft-tissue]
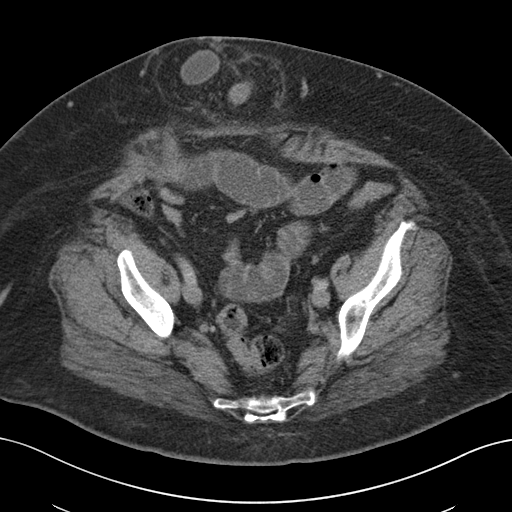
[im 31/85  soft-tissue]
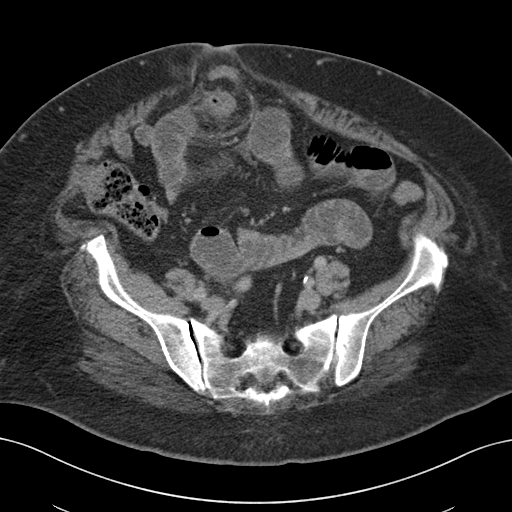
[im 37/85  soft-tissue]
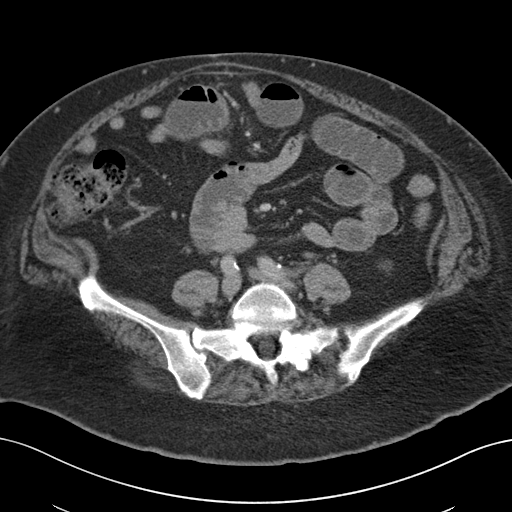
[im 43/85  soft-tissue]
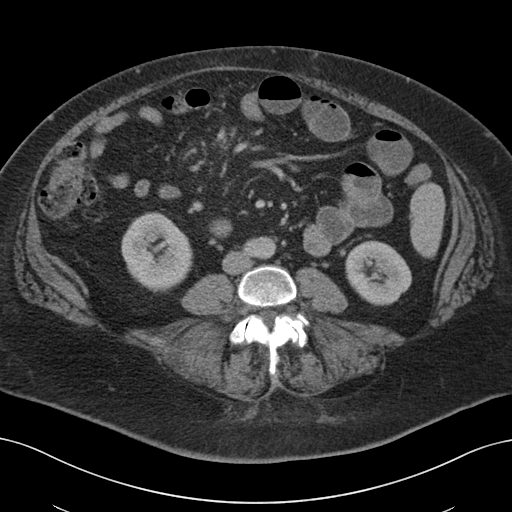
[im 49/85  soft-tissue]
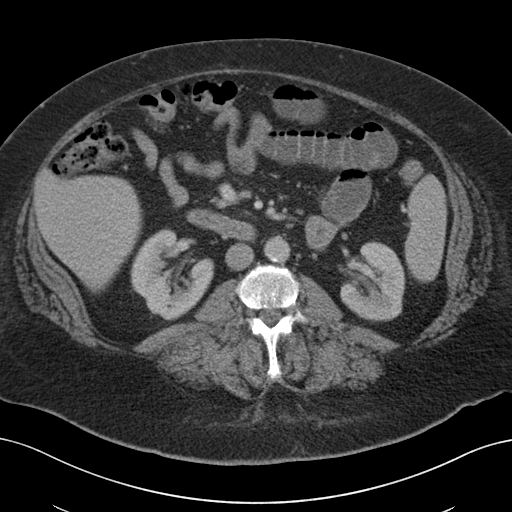
[im 55/85  soft-tissue]
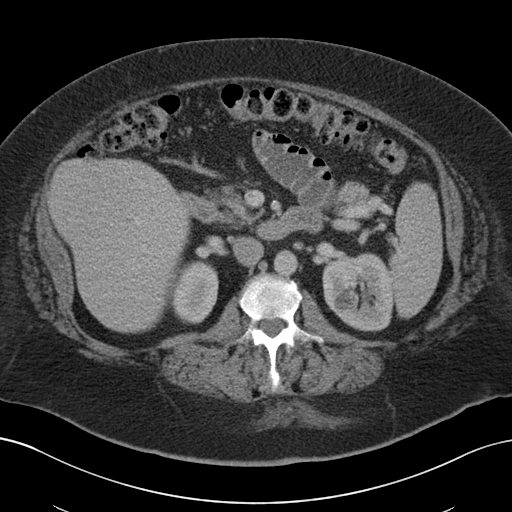
[im 55/85  bone]
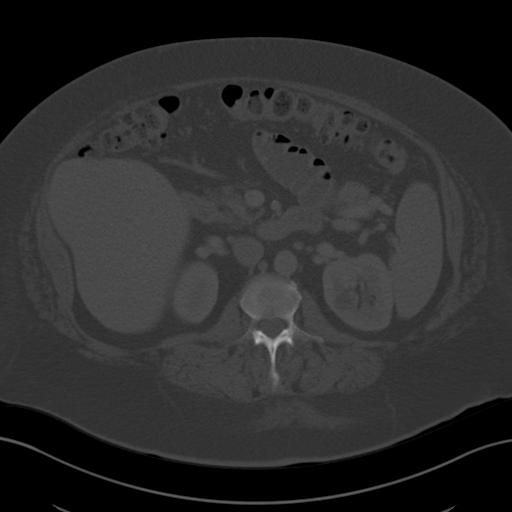
[im 61/85  soft-tissue]
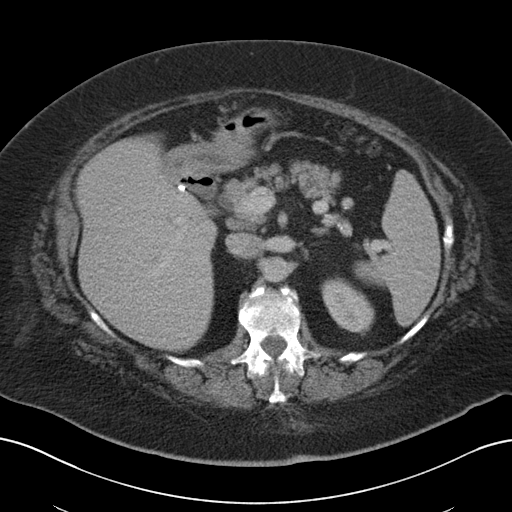
[im 67/85  soft-tissue]
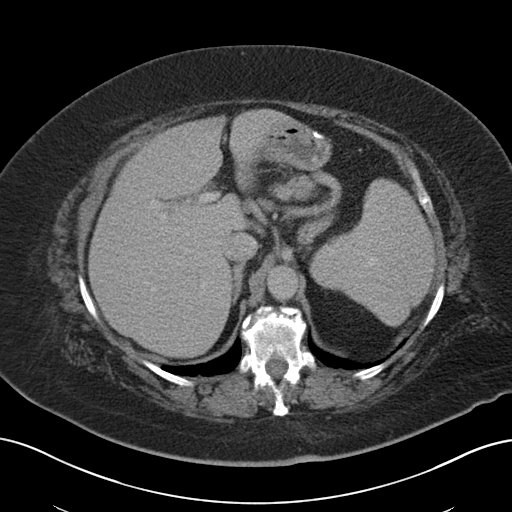
[im 73/85  soft-tissue]
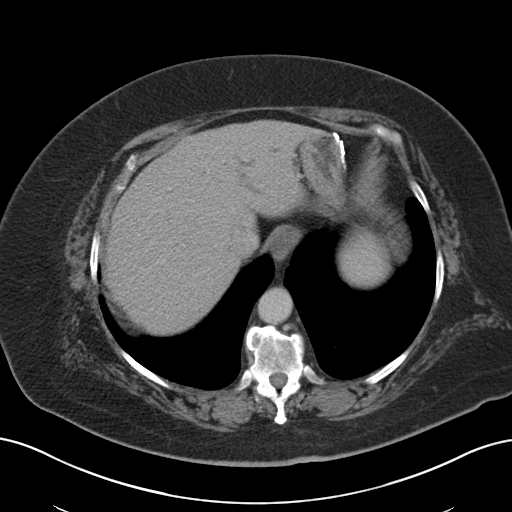
[im 79/85  soft-tissue]
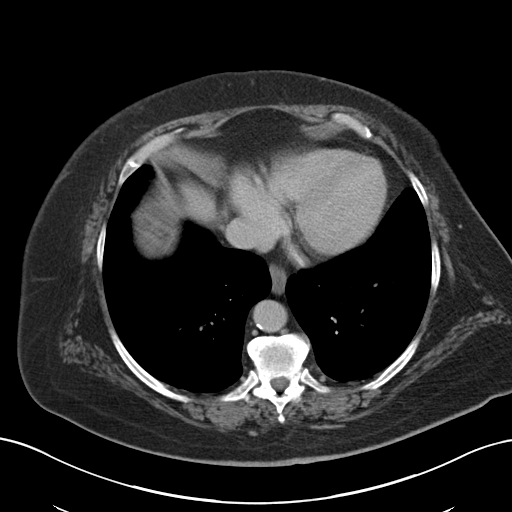

[Series 5: coronal st · coronal · 0.86mm/px · 3 of 167 slices shown]
[im 56/167  soft-tissue]
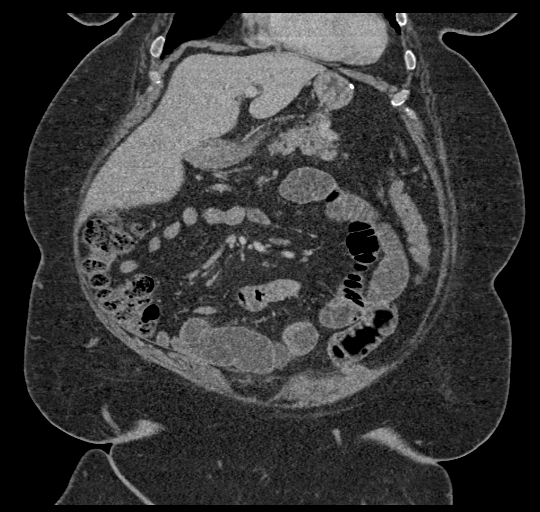
[im 74/167  soft-tissue]
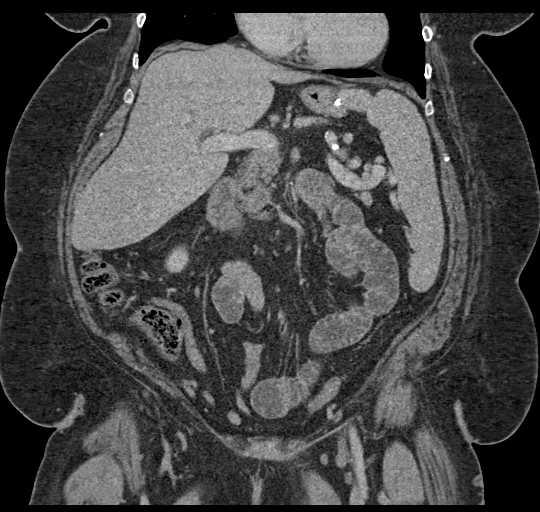
[im 93/167  soft-tissue]
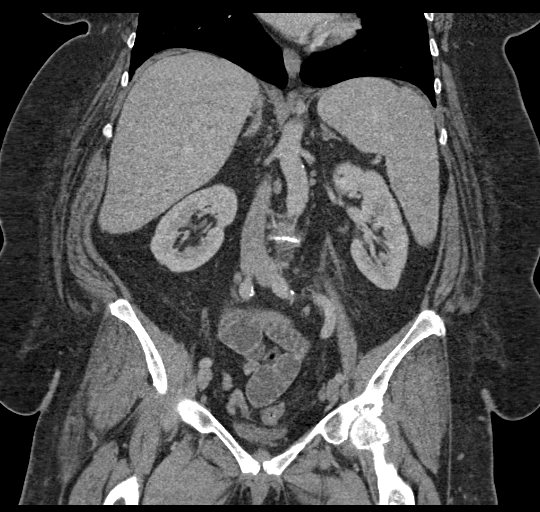

[16 of 46 positions shown; findings below may reference images not displayed]

FINDINGS: Lower chest: No acute abnormality.

Hepatobiliary: No focal liver abnormality is seen. Status post
cholecystectomy. No biliary dilatation. There is probable underlying
hepatic steatosis.

Pancreas: Unremarkable. No pancreatic ductal dilatation or
surrounding inflammatory changes.

Spleen: The spleen is significantly enlarged measuring approximately
17.9 cm craniocaudad.

Adrenals/Urinary Tract: Adrenal glands are unremarkable. Kidneys are
normal, without renal calculi, focal lesion, or hydronephrosis.
Bladder is unremarkable.

Stomach/Bowel: There are scattered colonic diverticula without CT
evidence of diverticulitis. The appendix is not reliably identified.
The patient is status post prior sleeve gastrectomy. There is a
small bowel obstruction with a transition point at the level of a
small bowel containing ventral wall hernia. There is a small amount
of free fluid and fat stranding within the hernia sac. The small
bowel proximal to the hernia is dilated with air-fluid levels
measuring up to approximately 3.5 cm. The small bowel distal to the
hernia is decompressed. There is no evidence of pneumatosis or free
air.

Vascular/Lymphatic: Aortic atherosclerosis. No enlarged abdominal or
pelvic lymph nodes.

Reproductive: Status post hysterectomy. No adnexal masses.

Other: No significant free fluid or free air within the abdomen or
pelvis.

Musculoskeletal: There is a unilateral pars defect at L5. There is
no displaced fracture. There is a benign-appearing sclerotic lesion
in the proximal partially visualized left femur.
IMPRESSION: 1. Moderate grade small bowel obstruction secondary to a small bowel
containing ventral wall hernia. There is a small amount of free
fluid within the hernia sac. Correlation for signs and symptoms of
incarceration or strangulation is recommended.
2. Status post sleeve gastrectomy and cholecystectomy.
3. Splenomegaly.
4. Hepatomegaly with hepatic steatosis.

## 2019-12-25 DIAGNOSIS — E782 Mixed hyperlipidemia: Secondary | ICD-10-CM | POA: Diagnosis not present

## 2019-12-25 DIAGNOSIS — I10 Essential (primary) hypertension: Secondary | ICD-10-CM | POA: Diagnosis not present

## 2019-12-25 DIAGNOSIS — G4733 Obstructive sleep apnea (adult) (pediatric): Secondary | ICD-10-CM | POA: Diagnosis not present

## 2019-12-25 DIAGNOSIS — K219 Gastro-esophageal reflux disease without esophagitis: Secondary | ICD-10-CM | POA: Diagnosis not present

## 2020-02-20 DIAGNOSIS — L732 Hidradenitis suppurativa: Secondary | ICD-10-CM | POA: Diagnosis not present

## 2020-03-05 DIAGNOSIS — G4733 Obstructive sleep apnea (adult) (pediatric): Secondary | ICD-10-CM | POA: Diagnosis not present

## 2020-03-13 ENCOUNTER — Encounter (HOSPITAL_COMMUNITY): Payer: Self-pay

## 2020-04-03 DIAGNOSIS — L814 Other melanin hyperpigmentation: Secondary | ICD-10-CM | POA: Diagnosis not present

## 2020-04-03 DIAGNOSIS — L578 Other skin changes due to chronic exposure to nonionizing radiation: Secondary | ICD-10-CM | POA: Diagnosis not present

## 2020-04-03 DIAGNOSIS — L821 Other seborrheic keratosis: Secondary | ICD-10-CM | POA: Diagnosis not present

## 2020-04-03 DIAGNOSIS — D485 Neoplasm of uncertain behavior of skin: Secondary | ICD-10-CM | POA: Diagnosis not present

## 2020-04-03 DIAGNOSIS — L732 Hidradenitis suppurativa: Secondary | ICD-10-CM | POA: Diagnosis not present

## 2020-04-03 DIAGNOSIS — D2272 Melanocytic nevi of left lower limb, including hip: Secondary | ICD-10-CM | POA: Diagnosis not present

## 2020-04-03 DIAGNOSIS — C44319 Basal cell carcinoma of skin of other parts of face: Secondary | ICD-10-CM | POA: Diagnosis not present

## 2020-05-30 DIAGNOSIS — C44319 Basal cell carcinoma of skin of other parts of face: Secondary | ICD-10-CM | POA: Diagnosis not present

## 2020-06-26 ENCOUNTER — Ambulatory Visit (INDEPENDENT_AMBULATORY_CARE_PROVIDER_SITE_OTHER): Payer: BC Managed Care – PPO

## 2020-06-26 ENCOUNTER — Encounter: Payer: Self-pay | Admitting: Podiatry

## 2020-06-26 ENCOUNTER — Ambulatory Visit (INDEPENDENT_AMBULATORY_CARE_PROVIDER_SITE_OTHER): Payer: BC Managed Care – PPO | Admitting: Podiatry

## 2020-06-26 ENCOUNTER — Other Ambulatory Visit: Payer: Self-pay | Admitting: Podiatry

## 2020-06-26 ENCOUNTER — Other Ambulatory Visit: Payer: Self-pay

## 2020-06-26 DIAGNOSIS — M79672 Pain in left foot: Secondary | ICD-10-CM | POA: Diagnosis not present

## 2020-06-26 DIAGNOSIS — M778 Other enthesopathies, not elsewhere classified: Secondary | ICD-10-CM

## 2020-06-26 DIAGNOSIS — L84 Corns and callosities: Secondary | ICD-10-CM

## 2020-06-26 DIAGNOSIS — M79671 Pain in right foot: Secondary | ICD-10-CM | POA: Diagnosis not present

## 2020-06-26 DIAGNOSIS — G629 Polyneuropathy, unspecified: Secondary | ICD-10-CM | POA: Diagnosis not present

## 2020-06-26 MED ORDER — GABAPENTIN 300 MG PO CAPS: 300.0000 mg | ORAL_CAPSULE | Freq: Three times a day (TID) | ORAL | 3 refills | Status: DC

## 2020-06-26 NOTE — Progress Notes (Signed)
Subjective:   Patient ID: [name], female   DOB: 59 y.o.   MRN: [id number]   HPI Patient presents stating she has had quite a bit of pain in her right foot and has developed a callus on the outside of the right foot and also had had previous foot surgery on top of the right foot.  States she does get discomfort also in the left foot but not to the same degree as the right foot.  Patient does not smoke likes to be active   Review of Systems  All other systems reviewed and are negative.       Objective:  Physical Exam Vitals and nursing note reviewed.  Constitutional:      Appearance: She is well-developed.  Pulmonary:     Effort: Pulmonary effort is normal.  Musculoskeletal:        General: Normal range of motion.  Skin:    General: Skin is warm.  Neurological:     Mental Status: She is alert.     Neurovascular status found to be intact muscle strength is within normal limits.  I did note quite a bit of irritation inflammation of the midfoot right with a previous scar where they attempted take a spur off which does not sound successful.  Also is noted to have discomfort in the outside of the fifth metatarsal with keratotic lesion formation and does have moderate inflammation of the left.  She gives me symptoms of some form of neuropathy but difficult to make complete determination     Assessment:  Possibility for arthritis in the midfoot right with extensor tendinitis with previous bone spur attempt resection versus neuropathy with corn callus formation     Plan:  H&P reviewed all conditions.  At this time I did do a steroid injection of the top of the right foot 3 mg Kenalog 5 mg Xylocaine to reduce the inflammation around the probable arthritic joint surfaces debrided the lesion and placed on gabapentin 300 mg 3 times daily starting one a day to toleration.  Reappoint 8 weeks or earlier if needed  X-rays indicate that there is some spurring dorsal midfoot right over left  indicating probable arthritic condition

## 2020-08-21 ENCOUNTER — Ambulatory Visit: Payer: BC Managed Care – PPO | Admitting: Podiatry

## 2020-09-17 DIAGNOSIS — G4733 Obstructive sleep apnea (adult) (pediatric): Secondary | ICD-10-CM | POA: Diagnosis not present

## 2020-10-28 DIAGNOSIS — I10 Essential (primary) hypertension: Secondary | ICD-10-CM | POA: Diagnosis not present

## 2020-10-28 DIAGNOSIS — E782 Mixed hyperlipidemia: Secondary | ICD-10-CM | POA: Diagnosis not present

## 2020-10-28 DIAGNOSIS — K219 Gastro-esophageal reflux disease without esophagitis: Secondary | ICD-10-CM | POA: Diagnosis not present

## 2020-10-28 DIAGNOSIS — F339 Major depressive disorder, recurrent, unspecified: Secondary | ICD-10-CM | POA: Diagnosis not present

## 2020-11-15 DIAGNOSIS — L732 Hidradenitis suppurativa: Secondary | ICD-10-CM | POA: Diagnosis not present

## 2020-12-02 DIAGNOSIS — M1612 Unilateral primary osteoarthritis, left hip: Secondary | ICD-10-CM | POA: Diagnosis not present

## 2020-12-02 DIAGNOSIS — M1711 Unilateral primary osteoarthritis, right knee: Secondary | ICD-10-CM | POA: Diagnosis not present

## 2020-12-02 DIAGNOSIS — M1712 Unilateral primary osteoarthritis, left knee: Secondary | ICD-10-CM | POA: Diagnosis not present

## 2020-12-04 DIAGNOSIS — E782 Mixed hyperlipidemia: Secondary | ICD-10-CM | POA: Diagnosis not present

## 2020-12-04 DIAGNOSIS — E559 Vitamin D deficiency, unspecified: Secondary | ICD-10-CM | POA: Diagnosis not present

## 2020-12-04 DIAGNOSIS — K219 Gastro-esophageal reflux disease without esophagitis: Secondary | ICD-10-CM | POA: Diagnosis not present

## 2020-12-25 DIAGNOSIS — L732 Hidradenitis suppurativa: Secondary | ICD-10-CM | POA: Diagnosis not present

## 2021-01-13 DIAGNOSIS — M1711 Unilateral primary osteoarthritis, right knee: Secondary | ICD-10-CM | POA: Diagnosis not present

## 2021-01-13 DIAGNOSIS — M25552 Pain in left hip: Secondary | ICD-10-CM | POA: Diagnosis not present

## 2021-01-16 DIAGNOSIS — L732 Hidradenitis suppurativa: Secondary | ICD-10-CM | POA: Diagnosis not present

## 2021-02-27 DIAGNOSIS — E782 Mixed hyperlipidemia: Secondary | ICD-10-CM | POA: Diagnosis not present

## 2021-03-17 ENCOUNTER — Encounter (HOSPITAL_COMMUNITY): Payer: Self-pay | Admitting: *Deleted

## 2021-03-27 DIAGNOSIS — G4733 Obstructive sleep apnea (adult) (pediatric): Secondary | ICD-10-CM | POA: Diagnosis not present

## 2021-06-10 DIAGNOSIS — L732 Hidradenitis suppurativa: Secondary | ICD-10-CM | POA: Diagnosis not present

## 2021-06-24 DIAGNOSIS — E782 Mixed hyperlipidemia: Secondary | ICD-10-CM | POA: Diagnosis not present

## 2021-06-24 DIAGNOSIS — M109 Gout, unspecified: Secondary | ICD-10-CM | POA: Diagnosis not present

## 2021-06-24 DIAGNOSIS — F339 Major depressive disorder, recurrent, unspecified: Secondary | ICD-10-CM | POA: Diagnosis not present

## 2021-06-24 DIAGNOSIS — Z9884 Bariatric surgery status: Secondary | ICD-10-CM | POA: Diagnosis not present

## 2021-08-21 DIAGNOSIS — L732 Hidradenitis suppurativa: Secondary | ICD-10-CM | POA: Diagnosis not present

## 2021-08-25 DIAGNOSIS — M25571 Pain in right ankle and joints of right foot: Secondary | ICD-10-CM | POA: Diagnosis not present

## 2022-02-03 DIAGNOSIS — M109 Gout, unspecified: Secondary | ICD-10-CM | POA: Diagnosis not present

## 2022-02-03 DIAGNOSIS — E559 Vitamin D deficiency, unspecified: Secondary | ICD-10-CM | POA: Diagnosis not present

## 2022-02-03 DIAGNOSIS — I10 Essential (primary) hypertension: Secondary | ICD-10-CM | POA: Diagnosis not present

## 2022-02-03 DIAGNOSIS — K219 Gastro-esophageal reflux disease without esophagitis: Secondary | ICD-10-CM | POA: Diagnosis not present

## 2022-02-03 DIAGNOSIS — E782 Mixed hyperlipidemia: Secondary | ICD-10-CM | POA: Diagnosis not present

## 2022-02-03 DIAGNOSIS — F339 Major depressive disorder, recurrent, unspecified: Secondary | ICD-10-CM | POA: Diagnosis not present

## 2022-03-13 ENCOUNTER — Encounter (HOSPITAL_COMMUNITY): Payer: Self-pay | Admitting: *Deleted

## 2022-03-24 DIAGNOSIS — B029 Zoster without complications: Secondary | ICD-10-CM | POA: Diagnosis not present

## 2022-04-01 DIAGNOSIS — B029 Zoster without complications: Secondary | ICD-10-CM | POA: Diagnosis not present

## 2022-04-01 DIAGNOSIS — R1013 Epigastric pain: Secondary | ICD-10-CM | POA: Diagnosis not present

## 2022-05-07 DIAGNOSIS — M1612 Unilateral primary osteoarthritis, left hip: Secondary | ICD-10-CM | POA: Diagnosis not present

## 2022-05-07 DIAGNOSIS — M25552 Pain in left hip: Secondary | ICD-10-CM | POA: Diagnosis not present

## 2022-05-07 DIAGNOSIS — M7062 Trochanteric bursitis, left hip: Secondary | ICD-10-CM | POA: Diagnosis not present

## 2022-06-22 DIAGNOSIS — R059 Cough, unspecified: Secondary | ICD-10-CM | POA: Diagnosis not present

## 2022-06-22 DIAGNOSIS — B029 Zoster without complications: Secondary | ICD-10-CM | POA: Diagnosis not present

## 2022-06-22 DIAGNOSIS — J029 Acute pharyngitis, unspecified: Secondary | ICD-10-CM | POA: Diagnosis not present

## 2022-07-27 DIAGNOSIS — G4733 Obstructive sleep apnea (adult) (pediatric): Secondary | ICD-10-CM | POA: Diagnosis not present

## 2022-08-27 DIAGNOSIS — G4733 Obstructive sleep apnea (adult) (pediatric): Secondary | ICD-10-CM | POA: Diagnosis not present

## 2022-12-16 DIAGNOSIS — K219 Gastro-esophageal reflux disease without esophagitis: Secondary | ICD-10-CM | POA: Diagnosis not present

## 2022-12-16 DIAGNOSIS — E782 Mixed hyperlipidemia: Secondary | ICD-10-CM | POA: Diagnosis not present

## 2022-12-16 DIAGNOSIS — R1030 Lower abdominal pain, unspecified: Secondary | ICD-10-CM | POA: Diagnosis not present

## 2022-12-16 DIAGNOSIS — Z23 Encounter for immunization: Secondary | ICD-10-CM | POA: Diagnosis not present

## 2022-12-21 ENCOUNTER — Other Ambulatory Visit: Payer: Self-pay | Admitting: Family Medicine

## 2022-12-21 DIAGNOSIS — R1030 Lower abdominal pain, unspecified: Secondary | ICD-10-CM

## 2023-01-11 ENCOUNTER — Telehealth: Payer: Self-pay

## 2023-01-11 MED ORDER — PREDNISONE 50 MG PO TABS: ORAL_TABLET | ORAL | 0 refills | Status: DC

## 2023-01-11 NOTE — Telephone Encounter (Signed)
Phone call to patient to review instructions for 13 hr prep for CT w/ contrast on 01/19/23 @ 11:20 AM. Prescription called into pts Pharmacy. Pt aware and verbalized understanding of instructions. Prescription: Pt to take 50 mg of prednisone on 01/18/23 at 10:20 PM, 50 mg of prednisone on 01/19/23 at 4:20 AM, and 50 mg of prednisone on 01/19/23 at 10:20 AM. Pt is also to take 50 mg of benadryl on 01/19/23 at 10:20 AM. Please call [phone] with any questions.   Pt reports she has benadryl at home and verbalized understanding of when to take this medication. I also advised the pt to have a driver the day of taking this medication as it may cause drowsiness.

## 2023-01-19 ENCOUNTER — Ambulatory Visit
Admission: RE | Admit: 2023-01-19 | Discharge: 2023-01-19 | Disposition: A | Discharge status: Home / Self Care | Payer: BC Managed Care – PPO | Source: Ambulatory Visit | Attending: Family Medicine | Admitting: Family Medicine

## 2023-01-19 DIAGNOSIS — R1030 Lower abdominal pain, unspecified: Secondary | ICD-10-CM | POA: Diagnosis not present

## 2023-01-19 DIAGNOSIS — Z8719 Personal history of other diseases of the digestive system: Secondary | ICD-10-CM | POA: Diagnosis not present

## 2023-01-19 DIAGNOSIS — R162 Hepatomegaly with splenomegaly, not elsewhere classified: Secondary | ICD-10-CM | POA: Diagnosis not present

## 2023-01-19 DIAGNOSIS — I700 Atherosclerosis of aorta: Secondary | ICD-10-CM | POA: Diagnosis not present

## 2023-01-19 MED ORDER — IOPAMIDOL (ISOVUE-300) INJECTION 61%
100.0000 mL | Freq: Once | INTRAVENOUS | Status: AC | PRN
  Administered 2023-01-19: 100 mL via INTRAVENOUS

## 2023-01-22 ENCOUNTER — Encounter: Payer: Self-pay | Admitting: Gastroenterology

## 2023-01-27 DIAGNOSIS — I10 Essential (primary) hypertension: Secondary | ICD-10-CM | POA: Diagnosis not present

## 2023-01-27 DIAGNOSIS — E782 Mixed hyperlipidemia: Secondary | ICD-10-CM | POA: Diagnosis not present

## 2023-01-27 DIAGNOSIS — K912 Postsurgical malabsorption, not elsewhere classified: Secondary | ICD-10-CM | POA: Diagnosis not present

## 2023-01-27 DIAGNOSIS — Z9189 Other specified personal risk factors, not elsewhere classified: Secondary | ICD-10-CM | POA: Diagnosis not present

## 2023-01-27 DIAGNOSIS — G4733 Obstructive sleep apnea (adult) (pediatric): Secondary | ICD-10-CM | POA: Diagnosis not present

## 2023-01-27 DIAGNOSIS — K219 Gastro-esophageal reflux disease without esophagitis: Secondary | ICD-10-CM | POA: Diagnosis not present

## 2023-02-10 DIAGNOSIS — G4733 Obstructive sleep apnea (adult) (pediatric): Secondary | ICD-10-CM | POA: Diagnosis not present

## 2023-02-10 DIAGNOSIS — K912 Postsurgical malabsorption, not elsewhere classified: Secondary | ICD-10-CM | POA: Diagnosis not present

## 2023-02-10 DIAGNOSIS — I10 Essential (primary) hypertension: Secondary | ICD-10-CM | POA: Diagnosis not present

## 2023-02-10 DIAGNOSIS — E782 Mixed hyperlipidemia: Secondary | ICD-10-CM | POA: Diagnosis not present

## 2023-02-11 ENCOUNTER — Other Ambulatory Visit (HOSPITAL_COMMUNITY): Payer: Self-pay

## 2023-02-11 MED ORDER — WEGOVY 0.25 MG/0.5ML ~~LOC~~ SOAJ
0.2500 mg | SUBCUTANEOUS | 0 refills | Status: DC
  Filled 2023-02-11: qty 2, 28d supply, fill #0

## 2023-02-12 ENCOUNTER — Other Ambulatory Visit (HOSPITAL_COMMUNITY): Payer: Self-pay

## 2023-02-22 DIAGNOSIS — G4733 Obstructive sleep apnea (adult) (pediatric): Secondary | ICD-10-CM | POA: Diagnosis not present

## 2023-02-24 ENCOUNTER — Other Ambulatory Visit (HOSPITAL_COMMUNITY): Payer: Self-pay

## 2023-02-24 DIAGNOSIS — F5081 Binge eating disorder: Secondary | ICD-10-CM | POA: Diagnosis not present

## 2023-02-24 DIAGNOSIS — I10 Essential (primary) hypertension: Secondary | ICD-10-CM | POA: Diagnosis not present

## 2023-02-24 DIAGNOSIS — G4733 Obstructive sleep apnea (adult) (pediatric): Secondary | ICD-10-CM | POA: Diagnosis not present

## 2023-02-24 DIAGNOSIS — E782 Mixed hyperlipidemia: Secondary | ICD-10-CM | POA: Diagnosis not present

## 2023-02-24 MED ORDER — MECLIZINE HCL 25 MG PO CHEW
25.0000 mg | CHEWABLE_TABLET | Freq: Three times a day (TID) | ORAL | 0 refills | Status: AC | PRN
  Filled 2023-02-24 – 2023-03-08 (×2): qty 30, 10d supply, fill #0

## 2023-02-24 MED ORDER — WEGOVY 0.5 MG/0.5ML ~~LOC~~ SOAJ
0.5000 mg | SUBCUTANEOUS | 0 refills | Status: DC
  Filled 2023-02-24 – 2023-03-05 (×2): qty 2, 28d supply, fill #0

## 2023-02-24 MED ORDER — ONDANSETRON HCL 4 MG PO TABS
4.0000 mg | ORAL_TABLET | Freq: Three times a day (TID) | ORAL | 0 refills | Status: AC
  Filled 2023-02-24: qty 9, 3d supply, fill #0

## 2023-02-25 ENCOUNTER — Other Ambulatory Visit (HOSPITAL_COMMUNITY): Payer: Self-pay

## 2023-02-26 ENCOUNTER — Other Ambulatory Visit (HOSPITAL_COMMUNITY): Payer: Self-pay

## 2023-03-05 ENCOUNTER — Other Ambulatory Visit (HOSPITAL_COMMUNITY): Payer: Self-pay

## 2023-03-08 ENCOUNTER — Other Ambulatory Visit (HOSPITAL_COMMUNITY): Payer: Self-pay

## 2023-03-10 DIAGNOSIS — I10 Essential (primary) hypertension: Secondary | ICD-10-CM | POA: Diagnosis not present

## 2023-03-10 DIAGNOSIS — G4733 Obstructive sleep apnea (adult) (pediatric): Secondary | ICD-10-CM | POA: Diagnosis not present

## 2023-03-10 DIAGNOSIS — E782 Mixed hyperlipidemia: Secondary | ICD-10-CM | POA: Diagnosis not present

## 2023-03-10 DIAGNOSIS — F5081 Binge eating disorder: Secondary | ICD-10-CM | POA: Diagnosis not present

## 2023-03-19 ENCOUNTER — Encounter (HOSPITAL_COMMUNITY): Payer: Self-pay | Admitting: *Deleted

## 2023-03-19 DIAGNOSIS — M109 Gout, unspecified: Secondary | ICD-10-CM | POA: Diagnosis not present

## 2023-03-19 DIAGNOSIS — Z23 Encounter for immunization: Secondary | ICD-10-CM | POA: Diagnosis not present

## 2023-03-19 DIAGNOSIS — K219 Gastro-esophageal reflux disease without esophagitis: Secondary | ICD-10-CM | POA: Diagnosis not present

## 2023-03-19 DIAGNOSIS — I10 Essential (primary) hypertension: Secondary | ICD-10-CM | POA: Diagnosis not present

## 2023-03-25 DIAGNOSIS — G4733 Obstructive sleep apnea (adult) (pediatric): Secondary | ICD-10-CM | POA: Diagnosis not present

## 2023-04-01 ENCOUNTER — Encounter: Payer: Self-pay | Admitting: Gastroenterology

## 2023-04-01 ENCOUNTER — Ambulatory Visit (INDEPENDENT_AMBULATORY_CARE_PROVIDER_SITE_OTHER): Payer: BC Managed Care – PPO | Admitting: Gastroenterology

## 2023-04-01 VITALS — BP 118/68 | HR 64 | Ht 61.0 in | Wt 259.0 lb

## 2023-04-01 DIAGNOSIS — R198 Other specified symptoms and signs involving the digestive system and abdomen: Secondary | ICD-10-CM | POA: Diagnosis not present

## 2023-04-01 DIAGNOSIS — R1031 Right lower quadrant pain: Secondary | ICD-10-CM

## 2023-04-01 DIAGNOSIS — Z903 Acquired absence of stomach [part of]: Secondary | ICD-10-CM | POA: Insufficient documentation

## 2023-04-01 DIAGNOSIS — K219 Gastro-esophageal reflux disease without esophagitis: Secondary | ICD-10-CM | POA: Diagnosis not present

## 2023-04-01 DIAGNOSIS — R162 Hepatomegaly with splenomegaly, not elsewhere classified: Secondary | ICD-10-CM

## 2023-04-01 MED ORDER — NA SULFATE-K SULFATE-MG SULF 17.5-3.13-1.6 GM/177ML PO SOLN: 1.0000 | Freq: Once | ORAL | 0 refills | Status: AC

## 2023-04-01 NOTE — H&P (View-Only) (Signed)
  Assessment    RLQ abdominal pain with standing - suspected musculoskeletal  CRC screening, average risk Alternating diarrhea and constipation, likely functional GERD S/P repair of incarcerated incisional hernia and omentectomy, 2020 S/P gastric sleeve, 2019 History of small bowel obstruction d/t #5 Mild stable HSM BMI=48.94 on semaglutide  History of difficult intubation   Recommendations   Schedule colonoscopy at hospital. The risks (including bleeding, perforation, infection, missed lesions, medication reactions and possible hospitalization or surgery if complications occur), benefits, and alternatives to colonoscopy with possible biopsy and possible polypectomy were discussed with the patient and they consent to proceed.   MiraLAX daily for constipation Dicyclomine 10 mg 4 times daily Continue omeprazole 20 mg daily and follow antireflux measures   HPI   Chief complaint: RLQ abdominal pain, alternating diarrhea and constipation  Patient profile:  [name] is a 61 y.o. female referred by Miller, Lisa, MD for lower abdominal pain, alternating diarrhea and constipation. PCP is Elizabeth Barnes, MD. she relates a several year history of right lower quadrant pain that bothers her when she is standing for long periods of time and is relieved by sitting.  She relates problems with joint pain in her hips.  She has a history of alternating diarrhea and constipation.  Her symptoms are under better control on daily MiraLAX.  She underwent colonoscopy in 2011 and EGD in 2013, as below.  Recent CTAP was unremarkable, as below.  She was started on semaglutide 6 weeks ago.  She relates an intentional 14 pound weight loss over the past 6 weeks. She denies change in stool caliber, melena, hematochezia, nausea, vomiting, dysphagia, chest pain.    Previous Labs / Imaging::    Latest Ref Rng & Units 05/11/2019    9:33 AM 05/10/2019    4:57 AM 05/09/2019    4:20 PM  CBC  WBC 4.0 - 10.5 K/uL  10.8  11.4  13.2   Hemoglobin 12.0 - 15.0 g/dL 12.3  13.7  15.3   Hematocrit 36.0 - 46.0 % 38.3  43.5  46.7   Platelets 150 - 400 K/uL 230  248  314     Lab Results  Component Value Date   LIPASE 25 05/09/2019      Latest Ref Rng & Units 05/11/2019    9:33 AM 05/10/2019    4:57 AM 05/09/2019    4:20 PM  CMP  Glucose 70 - 99 mg/dL 101  124  105   BUN 6 - 20 mg/dL 11  11  17   Creatinine 0.44 - 1.00 mg/dL 0.50  0.47  0.54   Sodium 135 - 145 mmol/L 140  142  141   Potassium 3.5 - 5.1 mmol/L 3.8  3.7  3.5   Chloride 98 - 111 mmol/L 102  109  105   CO2 22 - 32 mmol/L 29  25  25   Calcium 8.9 - 10.3 mg/dL 9.1  9.0  10.0   Total Protein 6.5 - 8.1 g/dL   7.8   Total Bilirubin 0.3 - 1.2 mg/dL   0.5   Alkaline Phos 38 - 126 U/L   67   AST 15 - 41 U/L   20   ALT 0 - 44 U/L   13      Previous GI evaluation    Endoscopies:  EGD June 2013 (Dr. Johnson) Normal per patient (report not available)  Colonoscopy June 2011 - 3 mm hyperplastic polyp - otherwise normal  Imaging:  CT ABDOMEN   PELVIS W CONTRAST CLINICAL DATA:  Lower abdominal pain for several months. Multiple previous surgeries and prior small-bowel obstruction.  EXAM: CT ABDOMEN AND PELVIS WITH CONTRAST  TECHNIQUE: Multidetector CT imaging of the abdomen and pelvis was performed using the standard protocol following bolus administration of intravenous contrast.  RADIATION DOSE REDUCTION: This exam was performed according to the departmental dose-optimization program which includes automated exposure control, adjustment of the mA and/or kV according to patient size and/or use of iterative reconstruction technique.  CONTRAST:  100mL ISOVUE-300 IOPAMIDOL (ISOVUE-300) INJECTION 61%  COMPARISON:  05/09/2019  FINDINGS: Lower Chest: No acute findings.  Hepatobiliary: No hepatic masses identified. Stable mild hepatomegaly. Prior cholecystectomy. No evidence of biliary obstruction.  Pancreas:  No mass or  inflammatory changes.  Spleen: Stable mild splenomegaly.  No splenic masses identified.  Adrenals/Urinary Tract: No suspicious masses identified. No evidence of ureteral calculi or hydronephrosis. Unremarkable unopacified urinary bladder.  Stomach/Bowel: Prior sleeve gastrectomy again noted. No evidence of obstruction, inflammatory process or abnormal fluid collections. Previously seen small bowel obstruction and ventral abdominal wall hernia are no longer visualized.  Vascular/Lymphatic: No pathologically enlarged lymph nodes. No acute vascular findings. Aortic atherosclerotic calcification incidentally noted.  Reproductive: Prior hysterectomy noted. Adnexal regions are unremarkable in appearance.  Other:  None.  Musculoskeletal:  No suspicious bone lesions identified.  IMPRESSION: No evidence of small-bowel obstruction or other acute findings.  Stable mild hepatosplenomegaly.  Aortic Atherosclerosis (ICD10-I70.0).  Electronically Signed   By: John A Stahl M.D.   On: 01/20/2023 17:12    Past Medical History:  Diagnosis Date   Arthritis    knees, right shoulder   Asthma    Carpal tunnel syndrome of left wrist 04/2013   Dental crowns present    Depression    Esophageal stricture    hx. of esophageal dilation x 1   GERD (gastroesophageal reflux disease)    Gout    History of difficult intubation hysterectomy   letter from 12-12-2008 on chart, 2 more surgeries since then no intubation problems   History of kidney stones    x 2 passed on own   Hyperlipidemia    Hypertension    under control with med., has been on med. x 5 yr.   Incarcerated incisional hernia s/p omentectomy & primary repair 05/10/2019 05/10/2019   Muscle pain    PONV (postoperative nausea and vomiting)    nausea   Pre-diabetes    pt does not check cbg at home   Sleep apnea    uses CPAP nightly set on 12.0   Past Surgical History:  Procedure Laterality Date   BILATERAL SALPINGOOPHORECTOMY   12/11/2008   CARPAL TUNNEL RELEASE Left 05/11/2013   Procedure: CARPAL TUNNEL RELEASE;  Surgeon: Robert V Sypher Jr., MD;  Location: [facility];  Service: Orthopedics;  Laterality: Left;   CHOLECYSTECTOMY  1985   open   ESOPHAGEAL DILATION     EXCISION MORTON'S NEUROMA Right 05/21/2014   Procedure: EXCISION MORTON'S NEUROMA;  Surgeon: Martha Ajlouny, DPM;  Location: [facility];  Service: Podiatry;  Laterality: Right;  neurectomy   HEEL SPUR RESECTION Right 05/21/2014   Procedure: HEEL SPUR RESECTION;  Surgeon: Martha Ajlouny, DPM;  Location: [facility];  Service: Podiatry;  Laterality: Right;  mid-foot bone resection    HYSTEROSCOPY WITH D & C  01/28/2001   INCISION AND DRAINAGE PERIRECTAL ABSCESS  07/03/2011   LAPAROSCOPIC GASTRIC SLEEVE RESECTION N/A 08/30/2018   Procedure: LAPAROSCOPIC GASTRIC SLEEVE RESECTION, Upper   Endo, ERAS Pathway;  Surgeon: Newman, David, MD;  Location: [facility];  Service: General;  Laterality: N/A;   LAPAROSCOPIC SUPRACERVICAL HYSTERECTOMY  12/11/2008   TONSILLECTOMY AND ADENOIDECTOMY  age 10   UMBILICAL HERNIA REPAIR N/A 05/10/2019   Procedure: REPAIR OF INCARCERATED INCISIONAL HERNIA, PARTIAL OMENTECTOMY;  Surgeon: Ingram, Haywood, MD;  Location: [facility];  Service: General;  Laterality: N/A;   Family History  Problem Relation Age of Onset   COPD Father    Hypertension Other    Social History   Tobacco Use   Smoking status: Never   Smokeless tobacco: Never  Vaping Use   Vaping Use: Never used  Substance Use Topics   Alcohol use: No   Drug use: No   Current Outpatient Medications  Medication Sig Dispense Refill   acetaminophen (TYLENOL 8 HOUR ARTHRITIS PAIN) 650 MG CR tablet Take 650-1,300 mg by mouth every 8 (eight) hours as needed for pain.     acetaminophen (TYLENOL) 500 MG tablet Take 500 mg by mouth every 6 (six) hours as needed for moderate pain.     allopurinol (ZYLOPRIM) 100 MG tablet Take 300 mg by mouth  daily.      Calcium Carbonate-Vit D-Min (CALCIUM 1200) 1200-1000 MG-UNIT CHEW Chew 3 tablets by mouth daily.     cholecalciferol (VITAMIN D3) 25 MCG (1000 UT) tablet Take 1,000 Units by mouth daily.     clindamycin (CLEOCIN T) 1 % external solution Apply 1 application topically daily as needed. Hidradenitis suppurativa  3   clindamycin (CLINDAGEL) 1 % gel Apply 1 application topically daily. Hidradenitis suppurativa  3   cyclobenzaprine (FLEXERIL) 10 MG tablet Take 10 mg by mouth at bedtime.      DULoxetine (CYMBALTA) 30 MG capsule Take 30 mg by mouth every evening.      gabapentin (NEURONTIN) 300 MG capsule Take 1 capsule (300 mg total) by mouth 3 (three) times daily. 90 capsule 3   lisinopril (PRINIVIL,ZESTRIL) 20 MG tablet Take 20 mg by mouth daily.     Meclizine HCl 25 MG CHEW Chew 1 tablet (25 mg total) by mouth every 8 (eight) hours as needed. 30 tablet 0   Multiple Vitamin (MULTIVITAMIN WITH MINERALS) TABS tablet Take 1 tablet by mouth daily.     omeprazole (PRILOSEC) 20 MG capsule Take 20 mg by mouth daily.     ondansetron (ZOFRAN) 4 MG tablet Take 1 tablet (4 mg total) by mouth every 8 (eight) hours as needed. 30 tablet 0   predniSONE (DELTASONE) 50 MG tablet Pt to take 50 mg of prednisone on 01/18/23 at 10:20 PM, 50 mg of prednisone on 01/19/23 at 4:20 AM, and 50 mg of prednisone on 01/19/23 at 10:20 AM. Pt is also to take 50 mg of benadryl on 01/19/23 at 10:20 AM. Please call [phone] with any questions. 3 tablet 0   Semaglutide-Weight Management (WEGOVY) 0.25 MG/0.5ML SOAJ Inject 0.25 mg into the skin once a week. 2 mL 0   Semaglutide-Weight Management (WEGOVY) 0.5 MG/0.5ML SOAJ Inject 0.5 mg into the skin once a week. 2 mL 0   No current facility-administered medications for this visit.   Allergies  Allergen Reactions   Aspirin Itching and Swelling   Demerol [Meperidine] Nausea And Vomiting   Nsaids Itching and Swelling   Other Nausea Only    PERFUMES CAUSE HEADACHE    Salicylates Itching and Swelling   Dilaudid [Hydromorphone Hcl] Itching   Iohexol Itching         Review of Systems:   All other systems reviewed and negative except where noted in HPI.    Physical Exam    Wt Readings from Last 3 Encounters:  05/10/19 226 lb (102.5 kg)  10/24/18 273 lb 4.8 oz (124 kg)  09/14/18 288 lb 3.2 oz (130.7 kg)    There were no vitals taken for this visit. Constitutional:  Generally well appearing female in no acute distress. HEENT: Pupils normal.  Conjunctivae are normal. No scleral icterus. No oral lesions or deformities noted.  Neck: Supple.  Cardiac: Normal rate, regular rhythm without murmurs. Pulmonary/chest: Effort normal and breath sounds normal. No wheezing, rales or rhonchi. Abdominal: Soft, nondistended, nontender. Active bowel sounds. No palpable HSM, masses or hernias. Rectal:  Deferred to colonoscopy  Extremities: No edema or deformities noted Neurological: Alert and oriented to person, place and time. Psychiatric: Pleasant. Normal mood and affect. Behavior is normal. Skin: Skin is warm and dry. No rashes noted.  Kellen Dutch, MD   cc:  Referring Provider Miller, Lisa, MD        

## 2023-04-01 NOTE — Progress Notes (Signed)
Assessment    RLQ abdominal pain with standing - suspected musculoskeletal  CRC screening, average risk Alternating diarrhea and constipation, likely functional GERD S/P repair of incarcerated incisional hernia and omentectomy, 2020 S/P gastric sleeve, 2019 History of small bowel obstruction d/t #5 Mild stable HSM BMI=48.94 on semaglutide  History of difficult intubation   Recommendations   Schedule colonoscopy at hospital. The risks (including bleeding, perforation, infection, missed lesions, medication reactions and possible hospitalization or surgery if complications occur), benefits, and alternatives to colonoscopy with possible biopsy and possible polypectomy were discussed with the patient and they consent to proceed.   MiraLAX daily for constipation Dicyclomine 10 mg 4 times daily Continue omeprazole 20 mg daily and follow antireflux measures   HPI   Chief complaint: RLQ abdominal pain, alternating diarrhea and constipation  Patient profile:  BREIONA COUVILLON is a 62 y.o. female referred by Sigmund Hazel, MD for lower abdominal pain, alternating diarrhea and constipation. PCP is Juluis Rainier, MD. she relates a several year history of right lower quadrant pain that bothers her when she is standing for long periods of time and is relieved by sitting.  She relates problems with joint pain in her hips.  She has a history of alternating diarrhea and constipation.  Her symptoms are under better control on daily MiraLAX.  She underwent colonoscopy in 2011 and EGD in 2013, as below.  Recent CTAP was unremarkable, as below.  She was started on semaglutide 6 weeks ago.  She relates an intentional 14 pound weight loss over the past 6 weeks. She denies change in stool caliber, melena, hematochezia, nausea, vomiting, dysphagia, chest pain.    Previous Labs / Imaging::    Latest Ref Rng & Units 05/11/2019    9:33 AM 05/10/2019    4:57 AM 05/09/2019    4:20 PM  CBC  WBC 4.0 - 10.5 K/uL  10.8  11.4  13.2   Hemoglobin 12.0 - 15.0 g/dL 16.1  09.6  04.5   Hematocrit 36.0 - 46.0 % 38.3  43.5  46.7   Platelets 150 - 400 K/uL 230  248  314     Lab Results  Component Value Date   LIPASE 25 05/09/2019      Latest Ref Rng & Units 05/11/2019    9:33 AM 05/10/2019    4:57 AM 05/09/2019    4:20 PM  CMP  Glucose 70 - 99 mg/dL 409  811  914   BUN 6 - 20 mg/dL 11  11  17    Creatinine 0.44 - 1.00 mg/dL 7.82  9.56  2.13   Sodium 135 - 145 mmol/L 140  142  141   Potassium 3.5 - 5.1 mmol/L 3.8  3.7  3.5   Chloride 98 - 111 mmol/L 102  109  105   CO2 22 - 32 mmol/L 29  25  25    Calcium 8.9 - 10.3 mg/dL 9.1  9.0  08.6   Total Protein 6.5 - 8.1 g/dL   7.8   Total Bilirubin 0.3 - 1.2 mg/dL   0.5   Alkaline Phos 38 - 126 U/L   67   AST 15 - 41 U/L   20   ALT 0 - 44 U/L   13      Previous GI evaluation    Endoscopies:  EGD June 2013 (Dr. Laural Benes) Normal per patient (report not available)  Colonoscopy June 2011 - 3 mm hyperplastic polyp - otherwise normal  Imaging:  CT ABDOMEN  PELVIS W CONTRAST CLINICAL DATA:  Lower abdominal pain for several months. Multiple previous surgeries and prior small-bowel obstruction.  EXAM: CT ABDOMEN AND PELVIS WITH CONTRAST  TECHNIQUE: Multidetector CT imaging of the abdomen and pelvis was performed using the standard protocol following bolus administration of intravenous contrast.  RADIATION DOSE REDUCTION: This exam was performed according to the departmental dose-optimization program which includes automated exposure control, adjustment of the mA and/or kV according to patient size and/or use of iterative reconstruction technique.  CONTRAST:  ISOVUE-300 IOPAMIDOL (ISOVUE-300) INJECTION 61%  COMPARISON:  05/09/2019  FINDINGS: Lower Chest: No acute findings.  Hepatobiliary: No hepatic masses identified. Stable mild hepatomegaly. Prior cholecystectomy. No evidence of biliary obstruction.  Pancreas:  No mass or  inflammatory changes.  Spleen: Stable mild splenomegaly.  No splenic masses identified.  Adrenals/Urinary Tract: No suspicious masses identified. No evidence of ureteral calculi or hydronephrosis. Unremarkable unopacified urinary bladder.  Stomach/Bowel: Prior sleeve gastrectomy again noted. No evidence of obstruction, inflammatory process or abnormal fluid collections. Previously seen small bowel obstruction and ventral abdominal wall hernia are no longer visualized.  Vascular/Lymphatic: No pathologically enlarged lymph nodes. No acute vascular findings. Aortic atherosclerotic calcification incidentally noted.  Reproductive: Prior hysterectomy noted. Adnexal regions are unremarkable in appearance.  Other:  None.  Musculoskeletal:  No suspicious bone lesions identified.  IMPRESSION: No evidence of small-bowel obstruction or other acute findings.  Stable mild hepatosplenomegaly.  Aortic Atherosclerosis (ICD10-I70.0).  Electronically Signed   By: Danae Orleans M.D.   On: 01/20/2023 17:12    Past Medical History:  Diagnosis Date   Arthritis    knees, right shoulder   Asthma    Carpal tunnel syndrome of left wrist 04/2013   Dental crowns present    Depression    Esophageal stricture    hx. of esophageal dilation x 1   GERD (gastroesophageal reflux disease)    Gout    History of difficult intubation hysterectomy   letter from 12-12-2008 on chart, 2 more surgeries since then no intubation problems   History of kidney stones    x 2 passed on own   Hyperlipidemia    Hypertension    under control with med., has been on med. x 5 yr.   Incarcerated incisional hernia s/p omentectomy & primary repair 05/10/2019 05/10/2019   Muscle pain    PONV (postoperative nausea and vomiting)    nausea   Pre-diabetes    pt does not check cbg at home   Sleep apnea    uses CPAP nightly set on 12.0   Past Surgical History:  Procedure Laterality Date   BILATERAL SALPINGOOPHORECTOMY   12/11/2008   CARPAL TUNNEL RELEASE Left 05/11/2013   Procedure: CARPAL TUNNEL RELEASE;  Surgeon: Wyn Forster., MD;  Location: [facility];  Service: Orthopedics;  Laterality: Left;   CHOLECYSTECTOMY  1985   open   ESOPHAGEAL DILATION     EXCISION MORTON'S NEUROMA Right 05/21/2014   Procedure: EXCISION MORTON'S NEUROMA;  Surgeon: Larey Dresser, DPM;  Location: [facility];  Service: Podiatry;  Laterality: Right;  neurectomy   HEEL SPUR RESECTION Right 05/21/2014   Procedure: HEEL SPUR RESECTION;  Surgeon: Larey Dresser, DPM;  Location: [facility];  Service: Podiatry;  Laterality: Right;  mid-foot bone resection    HYSTEROSCOPY WITH D & C  01/28/2001   INCISION AND DRAINAGE PERIRECTAL ABSCESS  07/03/2011   LAPAROSCOPIC GASTRIC SLEEVE RESECTION N/A 08/30/2018   Procedure: LAPAROSCOPIC GASTRIC SLEEVE RESECTION, Upper  Endo, ERAS Pathway;  Surgeon: Ovidio Kin, MD;  Location: [facility];  Service: General;  Laterality: N/A;   LAPAROSCOPIC SUPRACERVICAL HYSTERECTOMY  12/11/2008   TONSILLECTOMY AND ADENOIDECTOMY  age 60   UMBILICAL HERNIA REPAIR N/A 05/10/2019   Procedure: REPAIR OF INCARCERATED INCISIONAL HERNIA, PARTIAL OMENTECTOMY;  Surgeon: Claud Kelp, MD;  Location: [facility];  Service: General;  Laterality: N/A;   Family History  Problem Relation Age of Onset   COPD Father    Hypertension Other    Social History   Tobacco Use   Smoking status: Never   Smokeless tobacco: Never  Vaping Use   Vaping Use: Never used  Substance Use Topics   Alcohol use: No   Drug use: No   Current Outpatient Medications  Medication Sig Dispense Refill   acetaminophen (TYLENOL 8 HOUR ARTHRITIS PAIN) 650 MG CR tablet Take 650-1,300 mg by mouth every 8 (eight) hours as needed for pain.     acetaminophen (TYLENOL) 500 MG tablet Take 500 mg by mouth every 6 (six) hours as needed for moderate pain.     allopurinol (ZYLOPRIM) 100 MG tablet Take 300 mg by mouth  daily.      Calcium Carbonate-Vit D-Min (CALCIUM 1200) 1200-1000 MG-UNIT CHEW Chew 3 tablets by mouth daily.     cholecalciferol (VITAMIN D3) 25 MCG (1000 UT) tablet Take 1,000 Units by mouth daily.     clindamycin (CLEOCIN T) 1 % external solution Apply 1 application topically daily as needed. Hidradenitis suppurativa  3   clindamycin (CLINDAGEL) 1 % gel Apply 1 application topically daily. Hidradenitis suppurativa  3   cyclobenzaprine (FLEXERIL) 10 MG tablet Take 10 mg by mouth at bedtime.      DULoxetine (CYMBALTA) 30 MG capsule Take 30 mg by mouth every evening.      gabapentin (NEURONTIN) 300 MG capsule Take 1 capsule (300 mg total) by mouth 3 (three) times daily. 90 capsule 3   lisinopril (PRINIVIL,ZESTRIL) 20 MG tablet Take 20 mg by mouth daily.     Meclizine HCl 25 MG CHEW Chew 1 tablet (25 mg total) by mouth every 8 (eight) hours as needed. 30 tablet 0   Multiple Vitamin (MULTIVITAMIN WITH MINERALS) TABS tablet Take 1 tablet by mouth daily.     omeprazole (PRILOSEC) 20 MG capsule Take 20 mg by mouth daily.     ondansetron (ZOFRAN) 4 MG tablet Take 1 tablet (4 mg total) by mouth every 8 (eight) hours as needed. 30 tablet 0   predniSONE (DELTASONE) 50 MG tablet Pt to take 50 mg of prednisone on 01/18/23 at 10:20 PM, 50 mg of prednisone on 01/19/23 at 4:20 AM, and 50 mg of prednisone on 01/19/23 at 10:20 AM. Pt is also to take 50 mg of benadryl on 01/19/23 at 10:20 AM. Please call [phone] with any questions. 3 tablet 0   Semaglutide-Weight Management (WEGOVY) 0.25 MG/0.5ML SOAJ Inject 0.25 mg into the skin once a week. 2 mL 0   Semaglutide-Weight Management (WEGOVY) 0.5 MG/0.5ML SOAJ Inject 0.5 mg into the skin once a week. 2 mL 0   No current facility-administered medications for this visit.   Allergies  Allergen Reactions   Aspirin Itching and Swelling   Demerol [Meperidine] Nausea And Vomiting   Nsaids Itching and Swelling   Other Nausea Only    PERFUMES CAUSE HEADACHE    Salicylates Itching and Swelling   Dilaudid [Hydromorphone Hcl] Itching   Iohexol Itching         Review of Systems:  All other systems reviewed and negative except where noted in HPI.    Physical Exam    Wt Readings from Last 3 Encounters:  05/10/19 226 lb (102.5 kg)  10/24/18 273 lb 4.8 oz (124 kg)  09/14/18 288 lb 3.2 oz (130.7 kg)    There were no vitals taken for this visit. Constitutional:  Generally well appearing female in no acute distress. HEENT: Pupils normal.  Conjunctivae are normal. No scleral icterus. No oral lesions or deformities noted.  Neck: Supple.  Cardiac: Normal rate, regular rhythm without murmurs. Pulmonary/chest: Effort normal and breath sounds normal. No wheezing, rales or rhonchi. Abdominal: Soft, nondistended, nontender. Active bowel sounds. No palpable HSM, masses or hernias. Rectal:  Deferred to colonoscopy  Extremities: No edema or deformities noted Neurological: Alert and oriented to person, place and time. Psychiatric: Pleasant. Normal mood and affect. Behavior is normal. Skin: Skin is warm and dry. No rashes noted.  Claudette Head, MD   cc:  Referring Provider Sigmund Hazel, MD

## 2023-04-01 NOTE — Patient Instructions (Signed)
You have been scheduled for a colonoscopy. Please follow written instructions given to you at your visit today.  Please pick up your prep supplies at the pharmacy within the next 1-3 days. If you use inhalers (even only as needed), please bring them with you on the day of your procedure.  The [facility] providers would like to encourage you to use MYCHART to communicate with providers for non-urgent requests or questions.  Due to long hold times on the telephone, sending your provider a message by MYCHART may be a faster and more efficient way to get a response.  Please allow 48 business hours for a response.  Please remember that this is for non-urgent requests.   Due to recent changes in healthcare laws, you may see the results of your imaging and laboratory studies on MyChart before your provider has had a chance to review them.  We understand that in some cases there may be results that are confusing or concerning to you. Not all laboratory results come back in the same time frame and the provider may be waiting for multiple results in order to interpret others.  Please give us 48 hours in order for your provider to thoroughly review all the results before contacting the office for clarification of your results.   Thank you for choosing me and [facility].  Malcolm T. Stark, Jr., MD., FACG  

## 2023-04-05 DIAGNOSIS — I10 Essential (primary) hypertension: Secondary | ICD-10-CM | POA: Diagnosis not present

## 2023-04-05 DIAGNOSIS — M25551 Pain in right hip: Secondary | ICD-10-CM | POA: Diagnosis not present

## 2023-04-05 DIAGNOSIS — G4733 Obstructive sleep apnea (adult) (pediatric): Secondary | ICD-10-CM | POA: Diagnosis not present

## 2023-04-05 DIAGNOSIS — E782 Mixed hyperlipidemia: Secondary | ICD-10-CM | POA: Diagnosis not present

## 2023-04-06 ENCOUNTER — Other Ambulatory Visit (HOSPITAL_COMMUNITY): Payer: Self-pay

## 2023-04-06 MED ORDER — WEGOVY 0.5 MG/0.5ML ~~LOC~~ SOAJ
SUBCUTANEOUS | 0 refills | Status: AC
  Filled 2023-04-06: qty 2, 30d supply, fill #0

## 2023-04-08 ENCOUNTER — Other Ambulatory Visit (HOSPITAL_COMMUNITY): Payer: Self-pay

## 2023-04-15 ENCOUNTER — Encounter: Payer: Self-pay | Admitting: Gastroenterology

## 2023-04-20 DIAGNOSIS — M25551 Pain in right hip: Secondary | ICD-10-CM | POA: Diagnosis not present

## 2023-04-20 DIAGNOSIS — M25552 Pain in left hip: Secondary | ICD-10-CM | POA: Diagnosis not present

## 2023-04-22 ENCOUNTER — Encounter (HOSPITAL_COMMUNITY)
Admission: RE | Disposition: A | Discharge status: Home / Self Care | Payer: Self-pay | Source: Home / Self Care | Attending: Gastroenterology

## 2023-04-22 ENCOUNTER — Ambulatory Visit (HOSPITAL_COMMUNITY): Payer: BC Managed Care – PPO | Admitting: Anesthesiology

## 2023-04-22 ENCOUNTER — Ambulatory Visit (HOSPITAL_COMMUNITY)
Admission: RE | Admit: 2023-04-22 | Discharge: 2023-04-22 | Disposition: A | Discharge status: Home / Self Care | Payer: BC Managed Care – PPO | Attending: Gastroenterology | Admitting: Gastroenterology

## 2023-04-22 ENCOUNTER — Encounter (HOSPITAL_COMMUNITY): Payer: Self-pay | Admitting: Gastroenterology

## 2023-04-22 DIAGNOSIS — K219 Gastro-esophageal reflux disease without esophagitis: Secondary | ICD-10-CM | POA: Insufficient documentation

## 2023-04-22 DIAGNOSIS — Z9884 Bariatric surgery status: Secondary | ICD-10-CM | POA: Diagnosis not present

## 2023-04-22 DIAGNOSIS — K5900 Constipation, unspecified: Secondary | ICD-10-CM | POA: Diagnosis not present

## 2023-04-22 DIAGNOSIS — R198 Other specified symptoms and signs involving the digestive system and abdomen: Secondary | ICD-10-CM

## 2023-04-22 DIAGNOSIS — Z79899 Other long term (current) drug therapy: Secondary | ICD-10-CM | POA: Diagnosis not present

## 2023-04-22 DIAGNOSIS — Z7985 Long-term (current) use of injectable non-insulin antidiabetic drugs: Secondary | ICD-10-CM | POA: Diagnosis not present

## 2023-04-22 DIAGNOSIS — F418 Other specified anxiety disorders: Secondary | ICD-10-CM | POA: Insufficient documentation

## 2023-04-22 DIAGNOSIS — Z1211 Encounter for screening for malignant neoplasm of colon: Secondary | ICD-10-CM | POA: Diagnosis not present

## 2023-04-22 DIAGNOSIS — G4733 Obstructive sleep apnea (adult) (pediatric): Secondary | ICD-10-CM | POA: Diagnosis not present

## 2023-04-22 DIAGNOSIS — Z9989 Dependence on other enabling machines and devices: Secondary | ICD-10-CM | POA: Diagnosis not present

## 2023-04-22 DIAGNOSIS — Z903 Acquired absence of stomach [part of]: Secondary | ICD-10-CM

## 2023-04-22 DIAGNOSIS — I10 Essential (primary) hypertension: Secondary | ICD-10-CM | POA: Diagnosis not present

## 2023-04-22 DIAGNOSIS — R1031 Right lower quadrant pain: Secondary | ICD-10-CM

## 2023-04-22 DIAGNOSIS — Z6841 Body Mass Index (BMI) 40.0 and over, adult: Secondary | ICD-10-CM | POA: Insufficient documentation

## 2023-04-22 HISTORY — PX: COLONOSCOPY WITH PROPOFOL: SHX5780

## 2023-04-22 SURGERY — COLONOSCOPY WITH PROPOFOL
Anesthesia: Monitor Anesthesia Care

## 2023-04-22 MED ORDER — LACTATED RINGERS IV SOLN: INTRAVENOUS | Status: DC

## 2023-04-22 MED ORDER — PROPOFOL 500 MG/50ML IV EMUL
INTRAVENOUS | Status: DC | PRN
  Administered 2023-04-22: 25 mg via INTRAVENOUS
  Administered 2023-04-22: 125 ug/kg/min via INTRAVENOUS

## 2023-04-22 MED ORDER — SODIUM CHLORIDE 0.9 % IV SOLN: INTRAVENOUS | Status: DC

## 2023-04-22 SURGICAL SUPPLY — 22 items

## 2023-04-22 NOTE — Discharge Instructions (Signed)
YOU HAD AN ENDOSCOPIC PROCEDURE TODAY: Refer to the procedure report and other information in the discharge instructions given to you for any specific questions about what was found during the examination. If this information does not answer your questions, please call [place] office at [phone] to clarify.   YOU SHOULD EXPECT: Some feelings of bloating in the abdomen. Passage of more gas than usual. Walking can help get rid of the air that was put into your GI tract during the procedure and reduce the bloating. If you had a lower endoscopy (such as a colonoscopy or flexible sigmoidoscopy) you may notice spotting of blood in your stool or on the toilet paper. Some abdominal soreness may be present for a day or two, also.  DIET: Your first meal following the procedure should be a light meal and then it is ok to progress to your normal diet. A half-sandwich or bowl of soup is an example of a good first meal. Heavy or fried foods are harder to digest and may make you feel nauseous or bloated. Drink plenty of fluids but you should avoid alcoholic beverages for 24 hours. If you had a esophageal dilation, please see attached instructions for diet.    ACTIVITY: Your care partner should take you home directly after the procedure. You should plan to take it easy, moving slowly for the rest of the day. You can resume normal activity the day after the procedure however YOU SHOULD NOT DRIVE, use power tools, machinery or perform tasks that involve climbing or major physical exertion for 24 hours (because of the sedation medicines used during the test).   SYMPTOMS TO REPORT IMMEDIATELY: A gastroenterologist can be reached at any hour. Please call [phone]  for any of the following symptoms:  Following lower endoscopy (colonoscopy, flexible sigmoidoscopy) Excessive amounts of blood in the stool  Significant tenderness, worsening of abdominal pains  Swelling of the abdomen that is new, acute  Fever of 100 or  higher  Following upper endoscopy (EGD, EUS, ERCP, esophageal dilation) Vomiting of blood or coffee ground material  New, significant abdominal pain  New, significant chest pain or pain under the shoulder blades  Painful or persistently difficult swallowing  New shortness of breath  Black, tarry-looking or red, bloody stools  FOLLOW UP:  If any biopsies were taken you will be contacted by phone or by letter within the next 1-3 weeks. Call [phone]  if you have not heard about the biopsies in 3 weeks.  Please also call with any specific questions about appointments or follow up tests.YOU HAD AN ENDOSCOPIC PROCEDURE TODAY: Refer to the procedure report and other information in the discharge instructions given to you for any specific questions about what was found during the examination. If this information does not answer your questions, please call  office at [phone] to clarify.   YOU SHOULD EXPECT: Some feelings of bloating in the abdomen. Passage of more gas than usual. Walking can help get rid of the air that was put into your GI tract during the procedure and reduce the bloating. If you had a lower endoscopy (such as a colonoscopy or flexible sigmoidoscopy) you may notice spotting of blood in your stool or on the toilet paper. Some abdominal soreness may be present for a day or two, also.  DIET: Your first meal following the procedure should be a light meal and then it is ok to progress to your normal diet. A half-sandwich or bowl of soup is an example of a   good first meal. Heavy or fried foods are harder to digest and may make you feel nauseous or bloated. Drink plenty of fluids but you should avoid alcoholic beverages for 24 hours. If you had a esophageal dilation, please see attached instructions for diet.    ACTIVITY: Your care partner should take you home directly after the procedure. You should plan to take it easy, moving slowly for the rest of the day. You can resume  normal activity the day after the procedure however YOU SHOULD NOT DRIVE, use power tools, machinery or perform tasks that involve climbing or major physical exertion for 24 hours (because of the sedation medicines used during the test).   SYMPTOMS TO REPORT IMMEDIATELY: A gastroenterologist can be reached at any hour. Please call [phone]  for any of the following symptoms:  Following lower endoscopy (colonoscopy, flexible sigmoidoscopy) Excessive amounts of blood in the stool  Significant tenderness, worsening of abdominal pains  Swelling of the abdomen that is new, acute  Fever of 100 or higher  Following upper endoscopy (EGD, EUS, ERCP, esophageal dilation) Vomiting of blood or coffee ground material  New, significant abdominal pain  New, significant chest pain or pain under the shoulder blades  Painful or persistently difficult swallowing  New shortness of breath  Black, tarry-looking or red, bloody stools  FOLLOW UP:  If any biopsies were taken you will be contacted by phone or by letter within the next 1-3 weeks. Call [phone]  if you have not heard about the biopsies in 3 weeks.  Please also call with any specific questions about appointments or follow up tests. 

## 2023-04-22 NOTE — Anesthesia Preprocedure Evaluation (Addendum)
Anesthesia Evaluation  Patient identified by MRN, date of birth, ID band Patient awake    Reviewed: Allergy & Precautions, NPO status , Patient's Chart, lab work & pertinent test results  History of Anesthesia Complications (+) PONV, DIFFICULT AIRWAY and history of anesthetic complications  Airway Mallampati: II  TM Distance: >3 FB Neck ROM: Full    Dental  (+) Dental Advisory Given   Pulmonary asthma , sleep apnea and Continuous Positive Airway Pressure Ventilation    Pulmonary exam normal        Cardiovascular hypertension, Pt. on medications Normal cardiovascular exam     Neuro/Psych  Headaches PSYCHIATRIC DISORDERS Anxiety Depression     Neuromuscular disease    GI/Hepatic Neg liver ROS,GERD  Medicated and Controlled,, S/p gastric sleeve    Endo/Other    Morbid obesity  Renal/GU negative Renal ROS     Musculoskeletal  (+) Arthritis ,    Abdominal  (+) + obese  Peds  Hematology negative hematology ROS (+)   Anesthesia Other Findings Hx difficult airway (unable to locate record). 2 intubations uneventful using glidescope. On GLP-1a   Reproductive/Obstetrics                             Anesthesia Physical Anesthesia Plan  ASA: 3  Anesthesia Plan: MAC   Post-op Pain Management: Minimal or no pain anticipated   Induction:   PONV Risk Score and Plan: 3 and Propofol infusion and Treatment may vary due to age or medical condition  Airway Management Planned: Nasal Cannula and Natural Airway  Additional Equipment: None  Intra-op Plan:   Post-operative Plan:   Informed Consent: I have reviewed the patients History and Physical, chart, labs and discussed the procedure including the risks, benefits and alternatives for the proposed anesthesia with the patient or authorized representative who has indicated his/her understanding and acceptance.       Plan Discussed with: CRNA  and Anesthesiologist  Anesthesia Plan Comments:         Anesthesia Quick Evaluation

## 2023-04-22 NOTE — Op Note (Signed)
[facility] Patient Name: [name] Procedure Date : 04/22/2023 MRN: [id number] Attending MD: Meryl Dare , MD, [phone] Date of Birth: 03/11/1961 CSN: [id number] Age: 62 Admit Type: Outpatient Procedure:                Colonoscopy Indications:              Screening for colorectal malignant neoplasm Providers:                Venita Lick. Russella Dar, MD, Marge Duncans, RN, Martha Clan, RN, Priscella Mann, Technician Referring MD:             Dossie Der MD Medicines:                Monitored Anesthesia Care Complications:            No immediate complications. Estimated blood loss:                            None. Estimated Blood Loss:     Estimated blood loss: none. Procedure:                Pre-Anesthesia Assessment:                           - Prior to the procedure, a History and Physical                            was performed, and patient medications and                            allergies were reviewed. The patient's tolerance of                            previous anesthesia was also reviewed. The risks                            and benefits of the procedure and the sedation                            options and risks were discussed with the patient.                            All questions were answered, and informed consent                            was obtained. Prior Anticoagulants: The patient has                            taken no anticoagulant or antiplatelet agents. ASA                            Grade Assessment: III - A patient with severe  systemic disease. After reviewing the risks and                            benefits, the patient was deemed in satisfactory                            condition to undergo the procedure.                           After obtaining informed consent, the colonoscope                            was passed under direct vision. Throughout the                             procedure, the patient's blood pressure, pulse, and                            oxygen saturations were monitored continuously. The                            CF-HQ190L ([id number]) Olympus coloscope was                            introduced through the anus and advanced to the the                            terminal ileum, with identification of the                            appendiceal orifice and IC valve. The terminal                            ileum, ileocecal valve, appendiceal orifice, and                            rectum were photographed. The quality of the bowel                            preparation was good. The colonoscopy was performed                            without difficulty. The patient tolerated the                            procedure well. Scope In: 10:54:21 AM Scope Out: 11:11:13 AM Scope Withdrawal Time: 0 hours 12 minutes 37 seconds  Total Procedure Duration: 0 hours 16 minutes 52 seconds  Findings:      The perianal and digital rectal examinations were normal.      The terminal ileum appeared normal.      The entire examined colon appeared normal on direct and retroflexion       views. Impression:               - The entire examined colon  is normal on direct and                            retroflexion views.                           - Normal appearing terminal ileum.                           - No specimens collected. Recommendation:           - Repeat colonoscopy in 10 years for screening                            purposes.                           - Patient has a contact number available for                            emergencies. The signs and symptoms of potential                            delayed complications were discussed with the                            patient. Return to normal activities tomorrow.                            Written discharge instructions were provided to the                            patient.                            - Resume previous diet.                           - Continue present medications. Procedure Code(s):        --- Professional ---                           Z6109, Colorectal cancer screening; colonoscopy on                            individual not meeting criteria for high risk Diagnosis Code(s):        --- Professional ---                           Z12.11, Encounter for screening for malignant                            neoplasm of colon CPT copyright 2022 [employer]. All rights reserved. The codes documented in this report are preliminary and upon coder review may  be revised to meet current compliance requirements. Meryl Dare, MD 04/22/2023 11:16:18 AM This report has been signed electronically. Number of Addenda: 0

## 2023-04-22 NOTE — Interval H&P Note (Signed)
History and Physical Interval Note:  04/22/2023 10:38 AM  [name]  has presented today for surgery, with the diagnosis of abd pain, alternating constipation and diarrhea.  The various methods of treatment have been discussed with the patient and family. After consideration of risks, benefits and other options for treatment, the patient has consented to  Procedure(s): COLONOSCOPY WITH PROPOFOL (N/A) as a surgical intervention.  The patient's history has been reviewed, patient examined, no change in status, stable for surgery.  I have reviewed the patient's chart and labs.  Questions were answered to the patient's satisfaction.     Venita Lick. Russella Dar

## 2023-04-22 NOTE — Anesthesia Postprocedure Evaluation (Signed)
Anesthesia Post Note  Patient: [name]  Procedure(s) Performed: COLONOSCOPY WITH PROPOFOL     Patient location during evaluation: PACU Anesthesia Type: MAC Level of consciousness: awake and alert Pain management: pain level controlled Vital Signs Assessment: post-procedure vital signs reviewed and stable Respiratory status: spontaneous breathing, nonlabored ventilation and respiratory function stable Cardiovascular status: stable and blood pressure returned to baseline Anesthetic complications: no   No notable events documented.  Last Vitals:  Vitals:   04/22/23 1126 04/22/23 1136  BP: 102/67 105/69  Pulse: 60 60  Resp: 14 13  Temp:    SpO2: 97% 99%    Last Pain:  Vitals:   04/22/23 1136  TempSrc:   PainSc: 0-No pain                 Beryle Lathe

## 2023-04-22 NOTE — Transfer of Care (Signed)
Immediate Anesthesia Transfer of Care Note  Patient: [name]  Procedure(s) Performed: COLONOSCOPY WITH PROPOFOL  Patient Location: Endoscopy Unit  Anesthesia Type:MAC  Level of Consciousness: awake, drowsy, and patient cooperative  Airway & Oxygen Therapy: Patient Spontanous Breathing  Post-op Assessment: Report given to RN and Post -op Vital signs reviewed and stable  Post vital signs: Reviewed and stable  Last Vitals:  Vitals Value Taken Time  BP    Temp    Pulse    Resp    SpO2      Last Pain:  Vitals:   04/22/23 0949  TempSrc: Tympanic  PainSc: 0-No pain         Complications: No notable events documented.

## 2023-04-23 DIAGNOSIS — M25551 Pain in right hip: Secondary | ICD-10-CM | POA: Diagnosis not present

## 2023-04-23 DIAGNOSIS — M25552 Pain in left hip: Secondary | ICD-10-CM | POA: Diagnosis not present

## 2023-04-24 DIAGNOSIS — G4733 Obstructive sleep apnea (adult) (pediatric): Secondary | ICD-10-CM | POA: Diagnosis not present

## 2023-04-25 ENCOUNTER — Encounter (HOSPITAL_COMMUNITY): Payer: Self-pay | Admitting: Gastroenterology

## 2023-04-28 DIAGNOSIS — G4733 Obstructive sleep apnea (adult) (pediatric): Secondary | ICD-10-CM | POA: Diagnosis not present

## 2023-04-28 DIAGNOSIS — M25551 Pain in right hip: Secondary | ICD-10-CM | POA: Diagnosis not present

## 2023-04-28 DIAGNOSIS — E782 Mixed hyperlipidemia: Secondary | ICD-10-CM | POA: Diagnosis not present

## 2023-04-28 DIAGNOSIS — I10 Essential (primary) hypertension: Secondary | ICD-10-CM | POA: Diagnosis not present

## 2023-04-29 ENCOUNTER — Other Ambulatory Visit (HOSPITAL_COMMUNITY): Payer: Self-pay

## 2023-04-29 DIAGNOSIS — M25551 Pain in right hip: Secondary | ICD-10-CM | POA: Diagnosis not present

## 2023-04-29 DIAGNOSIS — M25552 Pain in left hip: Secondary | ICD-10-CM | POA: Diagnosis not present

## 2023-04-29 MED ORDER — WEGOVY 1 MG/0.5ML ~~LOC~~ SOAJ
1.0000 mg | SUBCUTANEOUS | 0 refills | Status: DC
  Filled 2023-04-29: qty 2, 28d supply, fill #0

## 2023-04-30 DIAGNOSIS — M25551 Pain in right hip: Secondary | ICD-10-CM | POA: Diagnosis not present

## 2023-04-30 DIAGNOSIS — M25552 Pain in left hip: Secondary | ICD-10-CM | POA: Diagnosis not present

## 2023-05-04 DIAGNOSIS — M25552 Pain in left hip: Secondary | ICD-10-CM | POA: Diagnosis not present

## 2023-05-04 DIAGNOSIS — M25551 Pain in right hip: Secondary | ICD-10-CM | POA: Diagnosis not present

## 2023-05-06 DIAGNOSIS — M25551 Pain in right hip: Secondary | ICD-10-CM | POA: Diagnosis not present

## 2023-05-06 DIAGNOSIS — M25552 Pain in left hip: Secondary | ICD-10-CM | POA: Diagnosis not present

## 2023-05-13 DIAGNOSIS — M25552 Pain in left hip: Secondary | ICD-10-CM | POA: Diagnosis not present

## 2023-05-13 DIAGNOSIS — M25551 Pain in right hip: Secondary | ICD-10-CM | POA: Diagnosis not present

## 2023-05-14 DIAGNOSIS — M25551 Pain in right hip: Secondary | ICD-10-CM | POA: Diagnosis not present

## 2023-05-14 DIAGNOSIS — M25552 Pain in left hip: Secondary | ICD-10-CM | POA: Diagnosis not present

## 2023-05-20 ENCOUNTER — Other Ambulatory Visit (HOSPITAL_COMMUNITY): Payer: Self-pay

## 2023-05-20 MED ORDER — WEGOVY 1 MG/0.5ML ~~LOC~~ SOAJ
1.0000 mg | SUBCUTANEOUS | 0 refills | Status: DC
  Filled 2023-05-20: qty 2, 28d supply, fill #0

## 2023-05-21 ENCOUNTER — Other Ambulatory Visit (HOSPITAL_COMMUNITY): Payer: Self-pay

## 2023-05-21 ENCOUNTER — Other Ambulatory Visit: Payer: Self-pay

## 2023-05-21 DIAGNOSIS — M25552 Pain in left hip: Secondary | ICD-10-CM | POA: Diagnosis not present

## 2023-05-21 DIAGNOSIS — M25551 Pain in right hip: Secondary | ICD-10-CM | POA: Diagnosis not present

## 2023-05-31 DIAGNOSIS — M25552 Pain in left hip: Secondary | ICD-10-CM | POA: Diagnosis not present

## 2023-05-31 DIAGNOSIS — M25551 Pain in right hip: Secondary | ICD-10-CM | POA: Diagnosis not present

## 2023-06-02 DIAGNOSIS — M25552 Pain in left hip: Secondary | ICD-10-CM | POA: Diagnosis not present

## 2023-06-02 DIAGNOSIS — M25551 Pain in right hip: Secondary | ICD-10-CM | POA: Diagnosis not present

## 2023-06-07 DIAGNOSIS — M25551 Pain in right hip: Secondary | ICD-10-CM | POA: Diagnosis not present

## 2023-06-07 DIAGNOSIS — M25552 Pain in left hip: Secondary | ICD-10-CM | POA: Diagnosis not present

## 2023-06-11 DIAGNOSIS — M25551 Pain in right hip: Secondary | ICD-10-CM | POA: Diagnosis not present

## 2023-06-11 DIAGNOSIS — M25552 Pain in left hip: Secondary | ICD-10-CM | POA: Diagnosis not present

## 2023-06-15 DIAGNOSIS — Z9189 Other specified personal risk factors, not elsewhere classified: Secondary | ICD-10-CM | POA: Diagnosis not present

## 2023-06-15 DIAGNOSIS — E782 Mixed hyperlipidemia: Secondary | ICD-10-CM | POA: Diagnosis not present

## 2023-06-15 DIAGNOSIS — G4733 Obstructive sleep apnea (adult) (pediatric): Secondary | ICD-10-CM | POA: Diagnosis not present

## 2023-06-15 DIAGNOSIS — M25551 Pain in right hip: Secondary | ICD-10-CM | POA: Diagnosis not present

## 2023-06-15 DIAGNOSIS — M25552 Pain in left hip: Secondary | ICD-10-CM | POA: Diagnosis not present

## 2023-06-15 DIAGNOSIS — I10 Essential (primary) hypertension: Secondary | ICD-10-CM | POA: Diagnosis not present

## 2023-06-16 ENCOUNTER — Other Ambulatory Visit (HOSPITAL_COMMUNITY): Payer: Self-pay

## 2023-06-16 MED ORDER — WEGOVY 1 MG/0.5ML ~~LOC~~ SOAJ
1.0000 mg | SUBCUTANEOUS | 0 refills | Status: AC
  Filled 2023-06-16 – 2023-06-24 (×2): qty 2, 28d supply, fill #0

## 2023-06-18 DIAGNOSIS — M25552 Pain in left hip: Secondary | ICD-10-CM | POA: Diagnosis not present

## 2023-06-18 DIAGNOSIS — M25551 Pain in right hip: Secondary | ICD-10-CM | POA: Diagnosis not present

## 2023-06-22 DIAGNOSIS — M25552 Pain in left hip: Secondary | ICD-10-CM | POA: Diagnosis not present

## 2023-06-22 DIAGNOSIS — M25551 Pain in right hip: Secondary | ICD-10-CM | POA: Diagnosis not present

## 2023-06-24 ENCOUNTER — Other Ambulatory Visit (HOSPITAL_COMMUNITY): Payer: Self-pay

## 2023-06-24 MED ORDER — WEGOVY 1.7 MG/0.75ML ~~LOC~~ SOAJ
1.7000 mg | SUBCUTANEOUS | 0 refills | Status: DC
  Filled 2023-06-24: qty 3, 28d supply, fill #0

## 2023-06-25 ENCOUNTER — Other Ambulatory Visit (HOSPITAL_COMMUNITY): Payer: Self-pay

## 2023-06-25 DIAGNOSIS — M25552 Pain in left hip: Secondary | ICD-10-CM | POA: Diagnosis not present

## 2023-06-25 DIAGNOSIS — M25551 Pain in right hip: Secondary | ICD-10-CM | POA: Diagnosis not present

## 2023-06-29 DIAGNOSIS — M25552 Pain in left hip: Secondary | ICD-10-CM | POA: Diagnosis not present

## 2023-06-29 DIAGNOSIS — M25551 Pain in right hip: Secondary | ICD-10-CM | POA: Diagnosis not present

## 2023-07-02 DIAGNOSIS — M25551 Pain in right hip: Secondary | ICD-10-CM | POA: Diagnosis not present

## 2023-07-02 DIAGNOSIS — M25552 Pain in left hip: Secondary | ICD-10-CM | POA: Diagnosis not present

## 2023-07-09 DIAGNOSIS — M25552 Pain in left hip: Secondary | ICD-10-CM | POA: Diagnosis not present

## 2023-07-09 DIAGNOSIS — M25551 Pain in right hip: Secondary | ICD-10-CM | POA: Diagnosis not present

## 2023-07-14 DIAGNOSIS — M25551 Pain in right hip: Secondary | ICD-10-CM | POA: Diagnosis not present

## 2023-07-14 DIAGNOSIS — M25552 Pain in left hip: Secondary | ICD-10-CM | POA: Diagnosis not present

## 2023-07-15 ENCOUNTER — Other Ambulatory Visit (HOSPITAL_COMMUNITY): Payer: Self-pay

## 2023-07-15 DIAGNOSIS — G4733 Obstructive sleep apnea (adult) (pediatric): Secondary | ICD-10-CM | POA: Diagnosis not present

## 2023-07-15 DIAGNOSIS — Z9189 Other specified personal risk factors, not elsewhere classified: Secondary | ICD-10-CM | POA: Diagnosis not present

## 2023-07-15 DIAGNOSIS — E782 Mixed hyperlipidemia: Secondary | ICD-10-CM | POA: Diagnosis not present

## 2023-07-15 DIAGNOSIS — I10 Essential (primary) hypertension: Secondary | ICD-10-CM | POA: Diagnosis not present

## 2023-07-15 MED ORDER — WEGOVY 1.7 MG/0.75ML ~~LOC~~ SOAJ
1.7000 mg | SUBCUTANEOUS | 0 refills | Status: DC
  Filled 2023-07-15: qty 3, 28d supply, fill #0

## 2023-07-16 DIAGNOSIS — M25551 Pain in right hip: Secondary | ICD-10-CM | POA: Diagnosis not present

## 2023-07-16 DIAGNOSIS — M25552 Pain in left hip: Secondary | ICD-10-CM | POA: Diagnosis not present

## 2023-07-20 DIAGNOSIS — M25551 Pain in right hip: Secondary | ICD-10-CM | POA: Diagnosis not present

## 2023-07-20 DIAGNOSIS — M25552 Pain in left hip: Secondary | ICD-10-CM | POA: Diagnosis not present

## 2023-07-22 ENCOUNTER — Other Ambulatory Visit (HOSPITAL_COMMUNITY): Payer: Self-pay

## 2023-07-22 DIAGNOSIS — M25552 Pain in left hip: Secondary | ICD-10-CM | POA: Diagnosis not present

## 2023-07-22 DIAGNOSIS — M25551 Pain in right hip: Secondary | ICD-10-CM | POA: Diagnosis not present

## 2023-07-23 DIAGNOSIS — G4733 Obstructive sleep apnea (adult) (pediatric): Secondary | ICD-10-CM | POA: Diagnosis not present

## 2023-08-18 DIAGNOSIS — I10 Essential (primary) hypertension: Secondary | ICD-10-CM | POA: Diagnosis not present

## 2023-08-18 DIAGNOSIS — Z9189 Other specified personal risk factors, not elsewhere classified: Secondary | ICD-10-CM | POA: Diagnosis not present

## 2023-08-18 DIAGNOSIS — M25551 Pain in right hip: Secondary | ICD-10-CM | POA: Diagnosis not present

## 2023-08-18 DIAGNOSIS — G4733 Obstructive sleep apnea (adult) (pediatric): Secondary | ICD-10-CM | POA: Diagnosis not present

## 2023-08-18 DIAGNOSIS — E782 Mixed hyperlipidemia: Secondary | ICD-10-CM | POA: Diagnosis not present

## 2023-08-19 ENCOUNTER — Other Ambulatory Visit (HOSPITAL_COMMUNITY): Payer: Self-pay

## 2023-08-19 MED ORDER — WEGOVY 1.7 MG/0.75ML ~~LOC~~ SOAJ
1.7000 mg | SUBCUTANEOUS | 3 refills | Status: AC
  Filled 2023-08-19: qty 3, 28d supply, fill #0

## 2023-08-23 DIAGNOSIS — G4733 Obstructive sleep apnea (adult) (pediatric): Secondary | ICD-10-CM | POA: Diagnosis not present

## 2023-08-25 DIAGNOSIS — M25551 Pain in right hip: Secondary | ICD-10-CM | POA: Diagnosis not present

## 2023-08-25 DIAGNOSIS — M25651 Stiffness of right hip, not elsewhere classified: Secondary | ICD-10-CM | POA: Diagnosis not present

## 2023-08-25 DIAGNOSIS — M25652 Stiffness of left hip, not elsewhere classified: Secondary | ICD-10-CM | POA: Diagnosis not present

## 2023-08-25 DIAGNOSIS — M25552 Pain in left hip: Secondary | ICD-10-CM | POA: Diagnosis not present

## 2023-08-30 DIAGNOSIS — M25651 Stiffness of right hip, not elsewhere classified: Secondary | ICD-10-CM | POA: Diagnosis not present

## 2023-08-30 DIAGNOSIS — M25652 Stiffness of left hip, not elsewhere classified: Secondary | ICD-10-CM | POA: Diagnosis not present

## 2023-08-30 DIAGNOSIS — M25551 Pain in right hip: Secondary | ICD-10-CM | POA: Diagnosis not present

## 2023-08-30 DIAGNOSIS — M25552 Pain in left hip: Secondary | ICD-10-CM | POA: Diagnosis not present

## 2023-09-03 DIAGNOSIS — M25551 Pain in right hip: Secondary | ICD-10-CM | POA: Diagnosis not present

## 2023-09-03 DIAGNOSIS — M25552 Pain in left hip: Secondary | ICD-10-CM | POA: Diagnosis not present

## 2023-09-03 DIAGNOSIS — M25652 Stiffness of left hip, not elsewhere classified: Secondary | ICD-10-CM | POA: Diagnosis not present

## 2023-09-03 DIAGNOSIS — M25651 Stiffness of right hip, not elsewhere classified: Secondary | ICD-10-CM | POA: Diagnosis not present

## 2023-09-06 DIAGNOSIS — M25551 Pain in right hip: Secondary | ICD-10-CM | POA: Diagnosis not present

## 2023-09-06 DIAGNOSIS — M25552 Pain in left hip: Secondary | ICD-10-CM | POA: Diagnosis not present

## 2023-09-06 DIAGNOSIS — M25651 Stiffness of right hip, not elsewhere classified: Secondary | ICD-10-CM | POA: Diagnosis not present

## 2023-09-06 DIAGNOSIS — M25652 Stiffness of left hip, not elsewhere classified: Secondary | ICD-10-CM | POA: Diagnosis not present

## 2023-09-10 DIAGNOSIS — M25651 Stiffness of right hip, not elsewhere classified: Secondary | ICD-10-CM | POA: Diagnosis not present

## 2023-09-10 DIAGNOSIS — M25652 Stiffness of left hip, not elsewhere classified: Secondary | ICD-10-CM | POA: Diagnosis not present

## 2023-09-10 DIAGNOSIS — M25552 Pain in left hip: Secondary | ICD-10-CM | POA: Diagnosis not present

## 2023-09-10 DIAGNOSIS — M25551 Pain in right hip: Secondary | ICD-10-CM | POA: Diagnosis not present

## 2023-09-14 DIAGNOSIS — M25552 Pain in left hip: Secondary | ICD-10-CM | POA: Diagnosis not present

## 2023-09-14 DIAGNOSIS — M25551 Pain in right hip: Secondary | ICD-10-CM | POA: Diagnosis not present

## 2023-09-14 DIAGNOSIS — M25652 Stiffness of left hip, not elsewhere classified: Secondary | ICD-10-CM | POA: Diagnosis not present

## 2023-09-14 DIAGNOSIS — M25651 Stiffness of right hip, not elsewhere classified: Secondary | ICD-10-CM | POA: Diagnosis not present

## 2023-09-15 ENCOUNTER — Other Ambulatory Visit (HOSPITAL_COMMUNITY): Payer: Self-pay

## 2023-09-15 DIAGNOSIS — I10 Essential (primary) hypertension: Secondary | ICD-10-CM | POA: Diagnosis not present

## 2023-09-15 DIAGNOSIS — Z9189 Other specified personal risk factors, not elsewhere classified: Secondary | ICD-10-CM | POA: Diagnosis not present

## 2023-09-15 DIAGNOSIS — E782 Mixed hyperlipidemia: Secondary | ICD-10-CM | POA: Diagnosis not present

## 2023-09-15 DIAGNOSIS — Z9884 Bariatric surgery status: Secondary | ICD-10-CM | POA: Diagnosis not present

## 2023-09-15 DIAGNOSIS — G4733 Obstructive sleep apnea (adult) (pediatric): Secondary | ICD-10-CM | POA: Diagnosis not present

## 2023-09-15 MED ORDER — WEGOVY 2.4 MG/0.75ML ~~LOC~~ SOAJ
2.4000 mg | SUBCUTANEOUS | 3 refills | Status: AC
  Filled 2023-09-15: qty 3, 28d supply, fill #0
  Filled 2023-10-15: qty 3, 28d supply, fill #1
  Filled 2023-11-06: qty 3, 28d supply, fill #2

## 2023-09-16 ENCOUNTER — Other Ambulatory Visit (HOSPITAL_COMMUNITY): Payer: Self-pay

## 2023-09-16 DIAGNOSIS — M25551 Pain in right hip: Secondary | ICD-10-CM | POA: Diagnosis not present

## 2023-09-16 DIAGNOSIS — M25552 Pain in left hip: Secondary | ICD-10-CM | POA: Diagnosis not present

## 2023-09-16 DIAGNOSIS — M25651 Stiffness of right hip, not elsewhere classified: Secondary | ICD-10-CM | POA: Diagnosis not present

## 2023-09-16 DIAGNOSIS — M25652 Stiffness of left hip, not elsewhere classified: Secondary | ICD-10-CM | POA: Diagnosis not present

## 2023-09-17 ENCOUNTER — Other Ambulatory Visit (HOSPITAL_COMMUNITY): Payer: Self-pay

## 2023-09-17 ENCOUNTER — Encounter: Payer: Self-pay | Admitting: Family Medicine

## 2023-09-17 ENCOUNTER — Other Ambulatory Visit: Payer: Self-pay | Admitting: Family Medicine

## 2023-09-17 DIAGNOSIS — E782 Mixed hyperlipidemia: Secondary | ICD-10-CM | POA: Diagnosis not present

## 2023-09-17 DIAGNOSIS — K219 Gastro-esophageal reflux disease without esophagitis: Secondary | ICD-10-CM | POA: Diagnosis not present

## 2023-09-17 DIAGNOSIS — I10 Essential (primary) hypertension: Secondary | ICD-10-CM | POA: Diagnosis not present

## 2023-09-17 DIAGNOSIS — Z1231 Encounter for screening mammogram for malignant neoplasm of breast: Secondary | ICD-10-CM

## 2023-09-17 DIAGNOSIS — M109 Gout, unspecified: Secondary | ICD-10-CM | POA: Diagnosis not present

## 2023-09-20 ENCOUNTER — Other Ambulatory Visit (HOSPITAL_COMMUNITY): Payer: Self-pay

## 2023-09-22 DIAGNOSIS — M25551 Pain in right hip: Secondary | ICD-10-CM | POA: Diagnosis not present

## 2023-09-22 DIAGNOSIS — M25652 Stiffness of left hip, not elsewhere classified: Secondary | ICD-10-CM | POA: Diagnosis not present

## 2023-09-22 DIAGNOSIS — M25552 Pain in left hip: Secondary | ICD-10-CM | POA: Diagnosis not present

## 2023-09-22 DIAGNOSIS — M25651 Stiffness of right hip, not elsewhere classified: Secondary | ICD-10-CM | POA: Diagnosis not present

## 2023-09-22 DIAGNOSIS — G4733 Obstructive sleep apnea (adult) (pediatric): Secondary | ICD-10-CM | POA: Diagnosis not present

## 2023-10-11 DIAGNOSIS — Z6841 Body Mass Index (BMI) 40.0 and over, adult: Secondary | ICD-10-CM | POA: Diagnosis not present

## 2023-10-11 DIAGNOSIS — M25551 Pain in right hip: Secondary | ICD-10-CM | POA: Diagnosis not present

## 2023-10-11 DIAGNOSIS — G4733 Obstructive sleep apnea (adult) (pediatric): Secondary | ICD-10-CM | POA: Diagnosis not present

## 2023-10-11 DIAGNOSIS — I10 Essential (primary) hypertension: Secondary | ICD-10-CM | POA: Diagnosis not present

## 2023-10-11 DIAGNOSIS — E782 Mixed hyperlipidemia: Secondary | ICD-10-CM | POA: Diagnosis not present

## 2023-10-11 DIAGNOSIS — Z79899 Other long term (current) drug therapy: Secondary | ICD-10-CM | POA: Diagnosis not present

## 2023-10-13 ENCOUNTER — Ambulatory Visit
Admission: RE | Admit: 2023-10-13 | Discharge: 2023-10-13 | Disposition: A | Discharge status: Home / Self Care | Payer: BC Managed Care – PPO | Source: Ambulatory Visit | Attending: Family Medicine | Admitting: Family Medicine

## 2023-10-13 DIAGNOSIS — Z1231 Encounter for screening mammogram for malignant neoplasm of breast: Secondary | ICD-10-CM | POA: Diagnosis not present

## 2023-10-15 ENCOUNTER — Other Ambulatory Visit (HOSPITAL_COMMUNITY): Payer: Self-pay

## 2023-11-05 DIAGNOSIS — G4733 Obstructive sleep apnea (adult) (pediatric): Secondary | ICD-10-CM | POA: Diagnosis not present

## 2023-11-06 ENCOUNTER — Other Ambulatory Visit (HOSPITAL_COMMUNITY): Payer: Self-pay

## 2023-12-14 DIAGNOSIS — I10 Essential (primary) hypertension: Secondary | ICD-10-CM | POA: Diagnosis not present

## 2023-12-14 DIAGNOSIS — Z9189 Other specified personal risk factors, not elsewhere classified: Secondary | ICD-10-CM | POA: Diagnosis not present

## 2023-12-14 DIAGNOSIS — G4733 Obstructive sleep apnea (adult) (pediatric): Secondary | ICD-10-CM | POA: Diagnosis not present

## 2023-12-14 DIAGNOSIS — E782 Mixed hyperlipidemia: Secondary | ICD-10-CM | POA: Diagnosis not present

## 2023-12-14 DIAGNOSIS — M25551 Pain in right hip: Secondary | ICD-10-CM | POA: Diagnosis not present

## 2024-01-17 DIAGNOSIS — N3000 Acute cystitis without hematuria: Secondary | ICD-10-CM | POA: Diagnosis not present

## 2024-02-01 DIAGNOSIS — G4733 Obstructive sleep apnea (adult) (pediatric): Secondary | ICD-10-CM | POA: Diagnosis not present

## 2024-02-01 DIAGNOSIS — Z6841 Body Mass Index (BMI) 40.0 and over, adult: Secondary | ICD-10-CM | POA: Diagnosis not present

## 2024-02-01 DIAGNOSIS — M25551 Pain in right hip: Secondary | ICD-10-CM | POA: Diagnosis not present

## 2024-02-01 DIAGNOSIS — Z79899 Other long term (current) drug therapy: Secondary | ICD-10-CM | POA: Diagnosis not present

## 2024-02-01 DIAGNOSIS — I10 Essential (primary) hypertension: Secondary | ICD-10-CM | POA: Diagnosis not present

## 2024-02-01 DIAGNOSIS — E782 Mixed hyperlipidemia: Secondary | ICD-10-CM | POA: Diagnosis not present

## 2024-02-04 DIAGNOSIS — G8929 Other chronic pain: Secondary | ICD-10-CM | POA: Diagnosis not present

## 2024-02-04 DIAGNOSIS — R109 Unspecified abdominal pain: Secondary | ICD-10-CM | POA: Diagnosis not present

## 2024-02-10 ENCOUNTER — Other Ambulatory Visit: Payer: Self-pay | Admitting: Family Medicine

## 2024-02-10 DIAGNOSIS — R109 Unspecified abdominal pain: Secondary | ICD-10-CM

## 2024-02-25 ENCOUNTER — Other Ambulatory Visit: Payer: Self-pay | Admitting: Family Medicine

## 2024-02-25 ENCOUNTER — Ambulatory Visit
Admission: RE | Admit: 2024-02-25 | Discharge: 2024-02-25 | Disposition: A | Discharge status: Home / Self Care | Source: Ambulatory Visit | Attending: Family Medicine | Admitting: Family Medicine

## 2024-02-25 DIAGNOSIS — R109 Unspecified abdominal pain: Secondary | ICD-10-CM

## 2024-03-06 DIAGNOSIS — I10 Essential (primary) hypertension: Secondary | ICD-10-CM | POA: Diagnosis not present

## 2024-03-06 DIAGNOSIS — G4733 Obstructive sleep apnea (adult) (pediatric): Secondary | ICD-10-CM | POA: Diagnosis not present

## 2024-03-06 DIAGNOSIS — Z9189 Other specified personal risk factors, not elsewhere classified: Secondary | ICD-10-CM | POA: Diagnosis not present

## 2024-03-06 DIAGNOSIS — E782 Mixed hyperlipidemia: Secondary | ICD-10-CM | POA: Diagnosis not present

## 2024-03-06 DIAGNOSIS — Z9884 Bariatric surgery status: Secondary | ICD-10-CM | POA: Diagnosis not present

## 2024-03-24 ENCOUNTER — Encounter (HOSPITAL_COMMUNITY): Payer: Self-pay | Admitting: *Deleted

## 2024-04-25 DIAGNOSIS — E782 Mixed hyperlipidemia: Secondary | ICD-10-CM | POA: Diagnosis not present

## 2024-04-25 DIAGNOSIS — Z9884 Bariatric surgery status: Secondary | ICD-10-CM | POA: Diagnosis not present

## 2024-04-25 DIAGNOSIS — I10 Essential (primary) hypertension: Secondary | ICD-10-CM | POA: Diagnosis not present

## 2024-04-25 DIAGNOSIS — G4733 Obstructive sleep apnea (adult) (pediatric): Secondary | ICD-10-CM | POA: Diagnosis not present

## 2024-06-12 DIAGNOSIS — Z9884 Bariatric surgery status: Secondary | ICD-10-CM | POA: Diagnosis not present

## 2024-06-12 DIAGNOSIS — E782 Mixed hyperlipidemia: Secondary | ICD-10-CM | POA: Diagnosis not present

## 2024-06-12 DIAGNOSIS — G4733 Obstructive sleep apnea (adult) (pediatric): Secondary | ICD-10-CM | POA: Diagnosis not present

## 2024-06-12 DIAGNOSIS — I10 Essential (primary) hypertension: Secondary | ICD-10-CM | POA: Diagnosis not present

## 2024-06-15 DIAGNOSIS — M160 Bilateral primary osteoarthritis of hip: Secondary | ICD-10-CM | POA: Diagnosis not present

## 2024-06-27 DIAGNOSIS — E782 Mixed hyperlipidemia: Secondary | ICD-10-CM | POA: Diagnosis not present

## 2024-06-27 DIAGNOSIS — Z01818 Encounter for other preprocedural examination: Secondary | ICD-10-CM | POA: Diagnosis not present

## 2024-06-27 DIAGNOSIS — I10 Essential (primary) hypertension: Secondary | ICD-10-CM | POA: Diagnosis not present

## 2024-06-27 DIAGNOSIS — M109 Gout, unspecified: Secondary | ICD-10-CM | POA: Diagnosis not present

## 2024-06-27 DIAGNOSIS — M1612 Unilateral primary osteoarthritis, left hip: Secondary | ICD-10-CM | POA: Diagnosis not present

## 2024-12-20 ENCOUNTER — Other Ambulatory Visit: Payer: Self-pay | Admitting: Orthopedic Surgery

## 2024-12-20 DIAGNOSIS — M25562 Pain in left knee: Secondary | ICD-10-CM

## 2024-12-21 ENCOUNTER — Ambulatory Visit
Admission: RE | Admit: 2024-12-21 | Discharge: 2024-12-21 | Disposition: A | Discharge status: Home / Self Care | Source: Ambulatory Visit | Attending: Orthopedic Surgery | Admitting: Orthopedic Surgery

## 2024-12-21 DIAGNOSIS — M25562 Pain in left knee: Secondary | ICD-10-CM
